# Patient Record
Sex: Female | Born: 1980 | Race: Black or African American | Hispanic: No | Marital: Married | Smoking: Never smoker
Health system: Southern US, Community
[De-identification: ages and names within clinical notes are randomized; demographics above are authoritative.]

## PROBLEM LIST (undated history)

## (undated) DIAGNOSIS — I10 Essential (primary) hypertension: Secondary | ICD-10-CM

## (undated) DIAGNOSIS — R7303 Prediabetes: Secondary | ICD-10-CM

## (undated) DIAGNOSIS — Z789 Other specified health status: Secondary | ICD-10-CM

## (undated) DIAGNOSIS — E282 Polycystic ovarian syndrome: Secondary | ICD-10-CM

## (undated) DIAGNOSIS — R87612 Low grade squamous intraepithelial lesion on cytologic smear of cervix (LGSIL): Secondary | ICD-10-CM

## (undated) DIAGNOSIS — N911 Secondary amenorrhea: Secondary | ICD-10-CM

## (undated) DIAGNOSIS — N979 Female infertility, unspecified: Secondary | ICD-10-CM

## (undated) DIAGNOSIS — L680 Hirsutism: Secondary | ICD-10-CM

## (undated) HISTORY — PX: NO PAST SURGERIES: SHX2092

## (undated) HISTORY — DX: Low grade squamous intraepithelial lesion on cytologic smear of cervix (LGSIL): R87.612

## (undated) HISTORY — DX: Hirsutism: L68.0

## (undated) HISTORY — DX: Essential (primary) hypertension: I10

## (undated) HISTORY — DX: Polycystic ovarian syndrome: E28.2

## (undated) HISTORY — DX: Female infertility, unspecified: N97.9

## (undated) HISTORY — DX: Secondary amenorrhea: N91.1

## (undated) HISTORY — DX: Prediabetes: R73.03

---

## 2005-01-02 DIAGNOSIS — R87612 Low grade squamous intraepithelial lesion on cytologic smear of cervix (LGSIL): Secondary | ICD-10-CM

## 2005-01-02 HISTORY — DX: Low grade squamous intraepithelial lesion on cytologic smear of cervix (LGSIL): R87.612

## 2007-01-24 ENCOUNTER — Emergency Department: Payer: Self-pay | Admitting: Unknown Physician Specialty

## 2009-10-22 ENCOUNTER — Encounter: Admission: RE | Admit: 2009-10-22 | Discharge: 2009-10-22 | Payer: Self-pay | Admitting: Family Medicine

## 2011-07-17 ENCOUNTER — Encounter: Payer: Self-pay | Admitting: Family Medicine

## 2011-08-11 ENCOUNTER — Encounter: Payer: Self-pay | Admitting: *Deleted

## 2011-08-11 ENCOUNTER — Emergency Department (HOSPITAL_BASED_OUTPATIENT_CLINIC_OR_DEPARTMENT_OTHER)
Admission: EM | Admit: 2011-08-11 | Discharge: 2011-08-11 | Disposition: A | Discharge status: Home / Self Care | Payer: Self-pay | Attending: Emergency Medicine | Admitting: Emergency Medicine

## 2011-08-11 DIAGNOSIS — R51 Headache: Secondary | ICD-10-CM | POA: Insufficient documentation

## 2011-08-11 DIAGNOSIS — R110 Nausea: Secondary | ICD-10-CM | POA: Insufficient documentation

## 2011-08-11 LAB — URINALYSIS, ROUTINE W REFLEX MICROSCOPIC
Bilirubin Urine: NEGATIVE
Hgb urine dipstick: NEGATIVE
Ketones, ur: NEGATIVE mg/dL
Leukocytes, UA: NEGATIVE
Protein, ur: NEGATIVE mg/dL
Urobilinogen, UA: 0.2 mg/dL (ref 0.0–1.0)

## 2011-08-11 MED ORDER — BUTALBITAL-APAP-CAFFEINE 50-325-40 MG PO TABS: 1.0000 | ORAL_TABLET | Freq: Four times a day (QID) | ORAL | Status: AC | PRN

## 2011-08-11 NOTE — ED Provider Notes (Signed)
History     CSN: [id number]  Arrival date & time 08/11/11  1503   First MD Initiated Contact with Patient 08/11/11 1612      Chief Complaint  Patient presents with  . Headache  . Nausea    (Consider location/radiation/quality/duration/timing/severity/associated sxs/prior treatment) Patient is a 31 y.o. female presenting with headaches. The history is provided by the patient. No language interpreter was used.  Headache  This is a new problem. The current episode started 6 to 12 hours ago. The problem occurs constantly. The problem has been gradually worsening. The headache is associated with nothing. The pain is located in the frontal region. The pain is at a severity of 7/10. The pain is moderate. The pain does not radiate. Associated symptoms include nausea. She has tried acetaminophen and aspirin for the symptoms. The treatment provided no relief.  Pt complains of headaches on and off.  Pt complains of nausea.  Pt has been having headaches for the past 2 weeks.  History reviewed. No pertinent past medical history.  History reviewed. No pertinent past surgical history.  No family history on file.  History  Substance Use Topics  . Smoking status: Never Smoker   . Smokeless tobacco: Never Used  . Alcohol Use: Yes     occasional    OB History    Grav Para Term Preterm Abortions TAB SAB Ect Mult Living                  Review of Systems  Gastrointestinal: Positive for nausea.  Neurological: Positive for headaches.  All other systems reviewed and are negative.    Allergies  Review of patient's allergies indicates no known allergies.  Home Medications   Current Outpatient Rx  Name Route Sig Dispense Refill  . EXCEDRIN TENSION HEADACHE PO Oral Take 2 tablets by mouth once.     . ADULT MULTIVITAMIN W/MINERALS CH Oral Take 1 tablet by mouth daily.        BP 130/82  Pulse 64  Temp(Src) 98.2 F (36.8 C) (Oral)  Resp 18  Ht 5\' 9"  (1.753 m)  Wt 264 lb (119.75 kg)   BMI 38.99 kg/m2  SpO2 100%  LMP 07/29/2011  Physical Exam  Nursing note and vitals reviewed. Constitutional: She is oriented to person, place, and time. She appears well-developed and well-nourished.  HENT:  Head: Normocephalic and atraumatic.  Right Ear: External ear normal.  Left Ear: External ear normal.  Nose: Nose normal.  Mouth/Throat: Oropharynx is clear and moist.  Eyes: Conjunctivae and EOM are normal. Pupils are equal, round, and reactive to light.  Neck: Normal range of motion. Neck supple.  Cardiovascular: Normal rate and normal heart sounds.   Pulmonary/Chest: Effort normal and breath sounds normal.  Abdominal: Soft. Bowel sounds are normal.  Musculoskeletal: Normal range of motion.  Neurological: She is alert and oriented to person, place, and time. She has normal reflexes.  Skin: Skin is warm.  Psychiatric: She has a normal mood and affect.    ED Course  Procedures (including critical care time)  Labs Reviewed  URINALYSIS, ROUTINE W REFLEX MICROSCOPIC - Abnormal; Notable for the following:    APPearance CLOUDY (*)    All other components within normal limits  PREGNANCY, URINE   No results found.   No diagnosis found.    MDM  Pt advised to follow up with her Md for recheck.   Rx for for Fiorcet for pain.       Langston Masker, [place]  08/11/11 1824 

## 2011-08-11 NOTE — ED Notes (Signed)
intermittent headache and nausea since Friday , denies vomiting

## 2011-08-12 NOTE — ED Provider Notes (Signed)
Medical screening examination/treatment/procedure(s) were performed by non-physician practitioner and as supervising physician I was immediately available for consultation/collaboration.   Darrah Dredge, MD 08/12/11 1233 

## 2013-06-22 ENCOUNTER — Ambulatory Visit (INDEPENDENT_AMBULATORY_CARE_PROVIDER_SITE_OTHER): Payer: No Typology Code available for payment source | Admitting: Family Medicine

## 2013-06-22 ENCOUNTER — Encounter: Payer: Self-pay | Admitting: Family Medicine

## 2013-06-22 VITALS — BP 128/92 | HR 83 | Temp 98.7°F | Resp 16 | Ht 67.5 in | Wt 282.2 lb

## 2013-06-22 DIAGNOSIS — Z0000 Encounter for general adult medical examination without abnormal findings: Secondary | ICD-10-CM

## 2013-06-22 DIAGNOSIS — R300 Dysuria: Secondary | ICD-10-CM

## 2013-06-22 DIAGNOSIS — E669 Obesity, unspecified: Secondary | ICD-10-CM

## 2013-06-22 DIAGNOSIS — N912 Amenorrhea, unspecified: Secondary | ICD-10-CM

## 2013-06-22 DIAGNOSIS — J019 Acute sinusitis, unspecified: Secondary | ICD-10-CM

## 2013-06-22 DIAGNOSIS — E65 Localized adiposity: Secondary | ICD-10-CM

## 2013-06-22 LAB — COMPREHENSIVE METABOLIC PANEL
ALT: 24 U/L (ref 0–35)
AST: 19 U/L (ref 0–37)
BUN: 12 mg/dL (ref 6–23)
Calcium: 8.8 mg/dL (ref 8.4–10.5)
Chloride: 103 mEq/L (ref 96–112)
Creat: 0.78 mg/dL (ref 0.50–1.10)
Glucose, Bld: 83 mg/dL (ref 70–99)
Potassium: 4.1 mEq/L (ref 3.5–5.3)
Total Bilirubin: 0.5 mg/dL (ref 0.3–1.2)
Total Protein: 7 g/dL (ref 6.0–8.3)

## 2013-06-22 LAB — POCT CBC
Granulocyte percent: 62.2 %G (ref 37–80)
HCT, POC: 44.6 % (ref 37.7–47.9)
Hemoglobin: 13.9 g/dL (ref 12.2–16.2)
Lymph, poc: 1.8 (ref 0.6–3.4)
MCHC: 31.2 g/dL — AB (ref 31.8–35.4)
MCV: 93.4 fL (ref 80–97)
MID (cbc): 0.5 (ref 0–0.9)
POC Granulocyte: 3.7 (ref 2–6.9)
POC LYMPH PERCENT: 30.1 %L (ref 10–50)
POC MID %: 7.7 %M (ref 0–12)
Platelet Count, POC: 257 10*3/uL (ref 142–424)
RDW, POC: 15.3 %
WBC: 5.9 10*3/uL (ref 4.6–10.2)

## 2013-06-22 LAB — POCT UA - MICROSCOPIC ONLY
RBC, urine, microscopic: NEGATIVE
Yeast, UA: NEGATIVE

## 2013-06-22 LAB — POCT GLYCOSYLATED HEMOGLOBIN (HGB A1C): Hemoglobin A1C: 5.2

## 2013-06-22 LAB — POCT URINALYSIS DIPSTICK
Blood, UA: NEGATIVE
Glucose, UA: NEGATIVE
Leukocytes, UA: NEGATIVE
Nitrite, UA: NEGATIVE
Protein, UA: NEGATIVE
Spec Grav, UA: 1.025

## 2013-06-22 LAB — LIPID PANEL: LDL Cholesterol: 110 mg/dL — ABNORMAL HIGH (ref 0–99)

## 2013-06-22 LAB — TSH: TSH: 2.567 u[IU]/mL (ref 0.350–4.500)

## 2013-06-22 MED ORDER — AMOXICILLIN 500 MG PO CAPS: 1000.0000 mg | ORAL_CAPSULE | Freq: Two times a day (BID) | ORAL | Status: DC

## 2013-06-22 NOTE — Patient Instructions (Signed)
We will refer you to OB to discuss getting your periods back.  Weight loss will also help with this!  Work on getting back into exercise (at least 4x a week) and watch your diet.  I will be in touch with your labs when they come in.    Switch to diet drinks!    Let us know if the pain in your right chest continues or comes back

## 2013-06-22 NOTE — Progress Notes (Signed)
[facility] [address], [place] [address] [phone]  Date:  06/22/2013   Name:  Hailey Harris   DOB:  [birth year]   MRN:  [id number]  PCP:  No primary provider on file.    Chief Complaint: Annual Exam   History of Present Illness:  Hailey Harris is a 32 y.o. very pleasant female patient who presents with the following:  Here today or a CPE and pap. She also has several other concerns today  Last pap was performed 03/2012.  She did have an abnormal pap in 2006.  She had colposcopy and all looked ok, ok follow-up since then.   She has noted urinary sx; urgency, no pain. No frequency.  She has noted this for about 3 months.  She does not notice any change in her schedule and is able to urinate regularly at her job.    LMP April 18th; she got off Depo in 2006 and notes infrequency menses since.  She is SA; she does not use any contraception.   She has not taken a pregnancy test at home. She has never been pregnant but would like to conceive in the next few years.    Also, she has been ill for the last 4 days with ST, runny nose, headache, sinus congestion.  She has not had a fever but she did have chills She has been coughing- non productive.    Last night she noted a pain under her right breast.  This is now resolved.  She noted it prior to laying down to sleep and when she was in bed. Non- exertional, she has not had this in the past  She also notes leg swelling and pains in her joints; this will come and go over the last few months.    Today her legs look normal.    She has gained about 30 lbs over the last 3 years.    No meds, no allergies.  Non- smoker, does drink on occasion.  No other major health problems in the past. Admits that she used to exercise a good bit but over the last few months she has stopped; "I just don't have the energy."     She is NOT fasting today There are no active problems to display for this patient.   History  reviewed. No pertinent past medical history.  History reviewed. No pertinent past surgical history.  History  Substance Use Topics  . Smoking status: Never Smoker   . Smokeless tobacco: Never Used  . Alcohol Use: Yes     Comment: occasional    Family History  Problem Relation Age of Onset  . Cancer Mother     breast cancer   . Heart disease Father     No Known Allergies  Medication list has been reviewed and updated.  Current Outpatient Prescriptions on File Prior to Visit  Medication Sig Dispense Refill  . Multiple Vitamin (MULITIVITAMIN WITH MINERALS) TABS Take 1 tablet by mouth daily.        . Acetaminophen-Caffeine (EXCEDRIN TENSION HEADACHE PO) Take 2 tablets by mouth once.        No current facility-administered medications on file prior to visit.    Review of Systems:  As per HPI- otherwise negative.   Physical Examination: Filed Vitals:   06/22/13 0859  BP: 128/92  Pulse: 83  Temp: 98.7 F (37.1 C)  Resp: 16   Filed Vitals:   06/22/13 0859  Height: 5' 7.5" (1.715  m)  Weight: 282 lb 3.2 oz (128.005 kg)   Body mass index is 43.52 kg/(m^2). Ideal Body Weight: Weight in (lb) to have BMI = 25: 161.7  GEN: WDWN, NAD, Non-toxic, A & O x 3, obese, looks well except for nasal congestion HEENT: Atraumatic, Normocephalic. Neck supple. No masses, No LAD.  Bilateral TM wnl, oropharynx normal.  PEERL,EOMI.   Ears and Nose: No external deformity. CV: RRR, No M/G/R. No JVD. No thrill. No extra heart sounds. PULM: CTA B, no wheezes, crackles, rhonchi. No retractions. No resp. distress. No accessory muscle use. ABD: S, NT, ND, +BS. No rebound. No HSM. EXTR: No c/c/e NEURO Normal gait.  PSYCH: Normally interactive. Conversant. Not depressed or anxious appearing.  Calm demeanor.  Breast exam: normal exam.  No masses or discharge.  Able to reproudce tenderness on her chest wall under the right breast Gu: normal exam.  Difficult to find cervix due to obesity.  No CMT,  no adnexal masses or tenderness  LE: no edema or tenderness   Results for orders placed in visit on 06/22/13  COMPREHENSIVE METABOLIC PANEL      Result Value Range   Sodium 139  135 - 145 mEq/L   Potassium 4.1  3.5 - 5.3 mEq/L   Chloride 103  96 - 112 mEq/L   CO2 30  19 - 32 mEq/L   Glucose, Bld 83  70 - 99 mg/dL   BUN 12  6 - 23 mg/dL   Creat 4.09  8.11 - 9.14 mg/dL   Total Bilirubin 0.5  0.3 - 1.2 mg/dL   Alkaline Phosphatase 82  39 - 117 U/L   AST 19  0 - 37 U/L   ALT 24  0 - 35 U/L   Total Protein 7.0  6.0 - 8.3 g/dL   Albumin 4.2  3.5 - 5.2 g/dL   Calcium 8.8  8.4 - 78.2 mg/dL  TSH      Result Value Range   TSH 2.567  0.350 - 4.500 uIU/mL  LIPID PANEL      Result Value Range   Cholesterol 173  0 - 200 mg/dL   Triglycerides 956  <213 mg/dL   HDL 42  >08 mg/dL   Total CHOL/HDL Ratio 4.1     VLDL 21  0 - 40 mg/dL   LDL Cholesterol 657 (*) 0 - 99 mg/dL  POCT CBC      Result Value Range   WBC 5.9  4.6 - 10.2 K/uL   Lymph, poc 1.8  0.6 - 3.4   POC LYMPH PERCENT 30.1  10 - 50 %L   MID (cbc) 0.5  0 - 0.9   POC MID % 7.7  0 - 12 %M   POC Granulocyte 3.7  2 - 6.9   Granulocyte percent 62.2  37 - 80 %G   RBC 4.77  4.04 - 5.48 M/uL   Hemoglobin 13.9  12.2 - 16.2 g/dL   HCT, POC 84.6  96.2 - 47.9 %   MCV 93.4  80 - 97 fL   MCH, POC 29.1  27 - 31.2 pg   MCHC 31.2 (*) 31.8 - 35.4 g/dL   RDW, POC 95.2     Platelet Count, POC 257  142 - 424 K/uL   MPV 9.7  0 - 99.8 fL  POCT UA - MICROSCOPIC ONLY      Result Value Range   WBC, Ur, HPF, POC 0-2     RBC, urine, microscopic neg  Bacteria, U Microscopic neg     Mucus, UA trace     Epithelial cells, urine per micros 0-2     Crystals, Ur, HPF, POC neg     Casts, Ur, LPF, POC neg     Yeast, UA neg    POCT URINALYSIS DIPSTICK      Result Value Range   Color, UA yellow     Clarity, UA clear     Glucose, UA neg     Bilirubin, UA neg     Ketones, UA neg     Spec Grav, UA 1.025     Blood, UA neg     pH, UA 5.5      Protein, UA neg     Urobilinogen, UA 1.0     Nitrite, UA neg     Leukocytes, UA Negative    POCT URINE PREGNANCY      Result Value Range   Preg Test, Ur Negative    POCT GLYCOSYLATED HEMOGLOBIN (HGB A1C)      Result Value Range   Hemoglobin A1C 5.2      Assessment and Plan: Physical exam - Plan: POCT CBC, Comprehensive metabolic panel, Pap IG, CT/NG w/ reflex HPV when ASC-U  Amenorrhea - Plan: POCT urine pregnancy, Ambulatory referral to Obstetrics / Gynecology  Dysuria - Plan: POCT UA - Microscopic Only, POCT urinalysis dipstick  Obesity, unspecified - Plan: POCT glycosylated hemoglobin (Hb A1C), TSH, Lipid panel  Sinusitis, acute - Plan: amoxicillin (AMOXIL) 500 MG capsule  Performed pap and exam.   Amenorrhea: explained that this is likely due to obesity.  As she would like to become pregnant will refer her to OBG for evaluation.  Encouraged weight loss to help with this issue and her general health  Treat for sinusitis with amoxicillin. No evidence of UTI or other urinary issue.  Check GC/CMZ on her pap.  Encouraged her to drink regularly during the day and void every few hours to see if a better schedule will improve her sx Will plan further follow- up pending labs. Discussed exercise and diet changes that may be helpful for her Suspect her CP was MSK in origin.  At this time her exam and VS are reassuring.  She will let us know if this recurs     Signed Abbe Amsterdam, MD

## 2013-06-23 LAB — PAP IG, CT-NG, RFX HPV ASCU

## 2013-06-28 ENCOUNTER — Encounter: Payer: Self-pay | Admitting: Obstetrics & Gynecology

## 2013-07-19 ENCOUNTER — Ambulatory Visit: Payer: No Typology Code available for payment source | Admitting: Podiatry

## 2013-07-19 ENCOUNTER — Ambulatory Visit (INDEPENDENT_AMBULATORY_CARE_PROVIDER_SITE_OTHER): Payer: No Typology Code available for payment source | Admitting: Podiatry

## 2013-07-19 ENCOUNTER — Encounter: Payer: Self-pay | Admitting: Podiatry

## 2013-07-19 ENCOUNTER — Ambulatory Visit (INDEPENDENT_AMBULATORY_CARE_PROVIDER_SITE_OTHER): Payer: No Typology Code available for payment source

## 2013-07-19 VITALS — BP 121/73 | HR 60 | Resp 16 | Ht 70.0 in | Wt 282.0 lb

## 2013-07-19 DIAGNOSIS — G5760 Lesion of plantar nerve, unspecified lower limb: Secondary | ICD-10-CM

## 2013-07-19 DIAGNOSIS — M79609 Pain in unspecified limb: Secondary | ICD-10-CM

## 2013-07-19 DIAGNOSIS — G5782 Other specified mononeuropathies of left lower limb: Secondary | ICD-10-CM

## 2013-07-19 DIAGNOSIS — M79672 Pain in left foot: Secondary | ICD-10-CM

## 2013-07-19 NOTE — Progress Notes (Signed)
   Subjective:    Patient ID: Hailey Harris, female    DOB: [birth year], 32 y.o.   MRN: [id number]  HPI Comments: Hailey Harris been having foot pain  N sharp shooting pain  L left 5th toe down to the lateral side of foot  D 1 month  O all of sudden , think twisted foot and had pain since  C got worse when started a new job on feet all day  A by the end of day its worse  T ice    Foot Pain Associated symptoms include coughing.      Review of Systems  HENT:       Sinus problems and some ringing in the ears  Respiratory: Positive for cough.   All other systems reviewed and are negative.       Objective:   Physical Exam: I have reviewed her past medical history medications allergies surgeries social history review of systems. Vital signs are stable she is alert and oriented x3. Pulses are strongly palpable bilateral. Capillary fill time to digits one through 5 bilateral foot is noted to be immediate. Deep tendon reflexes are intact symmetrical bilateral. Muscle strength is 5 over 5 dorsiflexors plantar flexors inverters everters all intrinsic musculature is intact. Orthopedic evaluation demonstrates all joints distal to the ankle a full range of motion without crepitus she has some tenderness on palpation of the lateral column of the left foot. Upon further evaluation is noted that she does have pain between the third and fourth digits of the left foot with a palpable Mulder's click. Radiographic evaluation does not demonstrate any type of osseous abnormalities to the left foot.        Assessment & Plan:  Assessment: Neuroma third interdigital space left foot.  Plan: Cortisone injection 20 mg of Kenalog third interdigital space left. Followup with me in one month.

## 2013-08-15 ENCOUNTER — Encounter: Payer: Self-pay | Admitting: Obstetrics & Gynecology

## 2013-08-16 ENCOUNTER — Ambulatory Visit: Payer: No Typology Code available for payment source | Admitting: Podiatry

## 2013-08-19 ENCOUNTER — Encounter: Payer: Self-pay | Admitting: Medical

## 2013-08-19 ENCOUNTER — Ambulatory Visit (INDEPENDENT_AMBULATORY_CARE_PROVIDER_SITE_OTHER): Payer: No Typology Code available for payment source | Admitting: Medical

## 2013-08-19 VITALS — BP 134/93 | HR 64 | Ht 71.0 in | Wt 282.7 lb

## 2013-08-19 DIAGNOSIS — N926 Irregular menstruation, unspecified: Secondary | ICD-10-CM

## 2013-08-19 NOTE — Progress Notes (Signed)
Patients lmp is 07/25/13. Prior to that her last period was in April. She has had 2 cycles per year for the last 4 years or so. Patient stated she was here for infertility, I advised that we don't do infertility in our office but can give her resources for it. We can see her for the amenorrhea, pt is agreeable to this.

## 2013-08-19 NOTE — Patient Instructions (Signed)
Infertility WHAT IS INFERTILITY?  Infertility is usually defined as not being able to get pregnant after trying for one year of regular sexual intercourse without the use of contraceptives. Or not being able to carry a pregnancy to term and have a baby. The infertility rate in the [place] is around 10%. Pregnancy is the result of a chain of events. A woman must release an egg from one of her ovaries (ovulation). The egg must be fertilized by the female sperm. Then it travels through a fallopian tube into the uterus (womb), where it attaches to the wall of the uterus and grows. A man must have enough sperm, and the sperm must join with (fertilize) the egg along the way, at the proper time. The fertilized egg must then become attached to the inside of the uterus. While this may seem simple, many things can happen to prevent pregnancy from occurring.  WHOSE PROBLEM IS IT?  About 20% of infertility cases are due to problems with the man (female factors) and 65% are due to problems with the woman (female factors). Other cases are due to a combination of female and female factors or to unknown causes.  WHAT CAUSES INFERTILITY IN MEN?  Infertility in men is often caused by problems with making enough normal sperm or getting the sperm to reach the egg. Problems with sperm may exist from birth or develop later in life, due to illness or injury. Some men produce no sperm, or produce too few sperm (oligospermia). Other problems include:  Sexual dysfunction.  Hormonal or endocrine problems.  Age. Female fertility decreases with age, but not at as young an age as female fertility.  Infection.  Congenital problems. Birth defect, such as absence of the tubes that carry the sperm (vas deferens).  Genetic/chromosomal problems.  Antisperm antibody problems.  Retrograde ejaculation (sperm go into the bladder).  Varicoceles, spematoceles, or tumors of the testicles.  Lifestyle can influence the number and  quality of a man's sperm.  Alcohol and drugs can temporarily reduce sperm quality.  Environmental toxins, including pesticides and lead, may cause some cases of infertility in men. WHAT CAUSES INFERTILITY IN WOMEN?   Problems with ovulation account for most infertility in women. Without ovulation, eggs are not available to be fertilized.  Signs of problems with ovulation include irregular menstrual periods or no periods at all.  Simple lifestyle factors, including stress, diet, or athletic training, can affect a woman's hormonal balance.  Age. Fertility begins to decrease in women in the early 76s and is worse after age 71.  Much less often, a hormonal imbalance from a serious medical problem, such as a pituitary gland tumor, thyroid or other chronic medical disease, can cause ovulation problems.  Pelvic infections.  Polycystic ovary syndrome (increase in female hormones, unable to ovulate).  Alcohol or illegal drugs.  Environmental toxins, radiation, pesticides, and certain chemicals.  Aging is an important factor in female infertility.  The ability of a woman's ovaries to produce eggs declines with age, especially after age 27. About one third of couples where the woman is over 21 will have problems with fertility.  By the time she reaches menopause when her monthly periods stop for good, a woman can no longer produce eggs or become pregnant.  Other problems can also lead to infertility in women. If the fallopian tubes are blocked at one or both ends, the egg cannot travel through the tubes into the uterus. Scar tissue (adhesions) in the pelvis may cause blocked  tubes. This may result from pelvic inflammatory disease, endometriosis, or surgery for an ectopic pregnancy (fertilized egg implanted outside the uterus) or any pelvic or abdominal surgery causing adhesions.  Fibroid tumors or polyps of the uterus.  Congenital (birth defect) abnormalities of the uterus.  Infection of the  cervix (cervicitis).  Cervical stenosis (narrowing).  Abnormal cervical mucus.  Polycystic ovary syndrome.  Having sexual intercourse too often (every other day or 4 to 5 times a week).  Obesity.  Anorexia.  Poor nutrition.  Over exercising, with loss of body fat.  DES. Your mother received diethylstilbesterol hormone when pregnant with you. HOW IS INFERTILITY TESTED?  If you have been trying to have a baby without success, you may want to seek medical help. You should not wait for one year of trying before seeing a health care provider if:  You are over 35.  You have reason to believe that there may be a fertility problem. A medical evaluation may determine the reasons for a couple's infertility. Usually this process begins with:  Physical exams.  Medical histories of both partners.  Sexual histories of both partners. If there is no obvious problem, like improperly timed intercourse or absence of ovulation, tests may be needed.   For a man, testing usually begins with tests of his semen to look at:  The number of sperm.  The shape of sperm.  Movement of his sperm.  Taking a complete medical and surgical history.  Physical examination.  Check for infection of the female reproductive organs. Sometimes hormone tests are done.   For a woman, the first step in testing is to find out if she is ovulating each month. There are several ways to do this. For example, she can keep track of changes in her morning body temperature and in the texture of her cervical mucus. Another tool is a home ovulation test kit, which can be bought at drug or grocery stores.  Checks of ovulation can also be done in the doctor's office, using blood tests for hormone levels or ultrasound tests of the ovaries. If the woman is ovulating, more tests will need to be done. Some common female tests include:  Hysterosalpingogram: An x-ray of the fallopian tubes and uterus after they are injected with  dye. It shows if the tubes are open and shows the shape of the uterus.  Laparoscopy: An exam of the tubes and other female organs for disease. A lighted tube called a laparoscope is used to see inside the abdomen.  Endometrial biopsy: Sample of uterus tissue taken on the first day of the menstrual period, to see if the tissue indicates you are ovulating.  Transvaginal ultrasound: Examines the female organs.  Hysteroscopy: Uses a lighted tube to examine the cervix and inside the uterus, to see if there are any abnormalities inside the uterus. TREATMENT  Depending on the test results, different treatments can be suggested. The type of treatment depends on the cause. 85 to 90% of infertility cases are treated with drugs or surgery.   Various fertility drugs may be used for women with ovulation problems. It is important to talk with your caregiver about the drug to be used. You should understand the drug's benefits and side effects. Depending on the type of fertility drug and the dosage of the drug used, multiple births (twins or multiples) can occur in some women.  If needed, surgery can be done to repair damage to a woman's ovaries, fallopian tubes, cervix, or uterus.  Surgery  or medical treatment for endometriosis or polycystic ovary syndrome. Sometimes a man has an infertility problem that can be corrected with medicine or by surgery.  Intrauterine insemination (IUI) of sperm, timed with ovulation.  Change in lifestyle, if that is the cause (lose weight, increase exercise, and stop smoking, drinking excessively, or taking illegal drugs).  Other types of surgery:  Removing growths inside and on the uterus.  Removing scar tissue from inside of the uterus.  Fixing blocked tubes.  Removing scar tissue in the pelvis and around the female organs. WHAT IS ASSISTED REPRODUCTIVE TECHNOLOGY (ART)?  Assisted reproductive technology (ART) is another form of special methods used to help infertile  couples. ART involves handling both the woman's eggs and the man's sperm. Success rates vary and depend on many factors. ART can be expensive and time-consuming. But ART has made it possible for many couples to have children that otherwise would not have been conceived. Some methods are listed below:  In vitro fertilization (IVF). IVF is often used when a woman's fallopian tubes are blocked or when a man has low sperm counts. A drug is used to stimulate the ovaries to produce multiple eggs. Once mature, the eggs are removed and placed in a culture dish with the man's sperm for fertilization. After about 40 hours, the eggs are examined to see if they have become fertilized by the sperm and are dividing into cells. These fertilized eggs (embryos) are then placed in the woman's uterus. This bypasses the fallopian tubes.  Gamete intrafallopian transfer (GIFT) is similar to IVF, but used when the woman has at least one normal fallopian tube. Three to five eggs are placed in the fallopian tube, along with the man's sperm, for fertilization inside the woman's body.  Zygote intrafallopian transfer (ZIFT), also called tubal embryo transfer, combines IVF and GIFT. The eggs retrieved from the woman's ovaries are fertilized in the lab and placed in the fallopian tubes rather than in the uterus.  ART procedures sometimes involve the use of donor eggs (eggs from another woman) or previously frozen embryos. Donor eggs may be used if a woman has impaired ovaries or has a genetic disease that could be passed on to her baby.  When performing ART, you are at higher risk for resulting in multiple pregnancies, twins, triplets or more.  Intracytoplasma sperm injection is a procedure that injects a single sperm into the egg to fertilize it.  Embryo transplant is a procedure that starts after growing an embryo in a special media (chemical solution) developed to keep the embryo alive for 2 to 5 days, and then transplanting it  into the uterus. In cases where a cause cannot be found and pregnancy does not occur, adoption may be a consideration. Document Released: 07/24/2003 Document Revised: 10/13/2011 Document Reviewed: 06/19/2009 [employer] Patient Information 2014 [employer].  

## 2013-08-19 NOTE — Progress Notes (Signed)
Patient ID: Hailey Harris, female   DOB: October 09, 1980, 33 y.o.   MRN: [id number]  History:  Hailey Harris is a 33 y.o. [id number] who presents to clinic today for irregular menses.  The patient states a 4 year history of ~ 2 cycles per year. She states that her cycles generally last 7 days. LMP 07/25/13. The patient was seen by [facility] in [place] in 2012 and given Provera. She took the Provera monthly x 1 year and had a period every month from 06/2011 - 06/2012. Last pap smear was normal 06/2013 performed at [facility]. She denies any vaginal discharge or bleeding today. No abdominal pain or other GYN concerns. The patient does desire pregnancy.  The following portions of the patient's history were reviewed and updated as appropriate: allergies, current medications, past family history, past medical history, past social history, past surgical history and problem list.  Review of Systems:  Pertinent items are noted in HPI.  Objective:  Physical Exam BP 134/93  Pulse 64  Ht 5\' 11"  (1.803 m)  Wt 282 lb 11.2 oz (128.232 kg)  BMI 39.45 kg/m2  LMP 07/25/2013 GENERAL: Well-developed, well-nourished female in no acute distress.  HEENT: Normocephalic, atraumatic.  NECK: Supple. Normal thyroid.  LUNGS: Normal rate. Clear to auscultation bilaterally.  HEART: Regular rate and rhythm with no adventitious sounds.  BREASTS: Deferred ABDOMEN: Soft, nontender, nondistended. No organomegaly. Normal bowel sounds appreciated in all quadrants.  PELVIC: Deferred EXTREMITIES: No cyanosis, clubbing, or edema.   Assessment & Plan:  Assessment: Irregular menses  Plans: 1. Patient given contact information for Dr. Kerin Perna for infertility appoitment 2. Patient advised to call [facility] clinic for return visit PRN  Farris Has, PA-C 08/19/2013 10:12 AM

## 2013-08-23 ENCOUNTER — Telehealth: Payer: Self-pay | Admitting: *Deleted

## 2013-08-23 NOTE — Telephone Encounter (Signed)
Pt left message stating she was seen in our office on 08/19/13 by Almyra Free. She was referred to infertility doctor bit her insurance does not cover those visits. She is requesting a Rx for Provera and states "she will take it more regularly this time."  I returned call to pt and left a message stating that I have sent a message to [employer] with her medication request. We will call her back once we have received a response.

## 2013-08-24 NOTE — Telephone Encounter (Signed)
Called patient and left message for her to call the office with regards to medication dosages.

## 2013-08-24 NOTE — Telephone Encounter (Addendum)
Diane,   I assume the patient was doing 10 mg x 10 days per month of Provera last time. Can we confirm that and then I will send Rx that is appropriate.   Thanks,  Almyra Free  Note from [facility]; I called pt back and left a new message stating that I have had a response from [place] and have additional questions about how she previously took the medication. Please call back and leave a message stating whether we may leave detailed medical information on her voice mail.

## 2013-08-25 ENCOUNTER — Other Ambulatory Visit: Payer: Self-pay | Admitting: Medical

## 2013-08-25 DIAGNOSIS — N912 Amenorrhea, unspecified: Secondary | ICD-10-CM

## 2013-08-25 MED ORDER — MEDROXYPROGESTERONE ACETATE 10 MG PO TABS: ORAL_TABLET | ORAL | Status: DC

## 2013-08-25 NOTE — Progress Notes (Signed)
Patient was told at last visit that if she could not be seen by infertility we could try provera challenge again. Patient called [facility] clinic and told RN that infertility would not accept her insurance and she wanted to try Provera. Rx sent to patient's pharmacy. Patient to return to [facility] clinic in 6 months for follow-up  Farris Has, PA-C 08/25/2013 1:00 PM

## 2013-08-25 NOTE — Telephone Encounter (Signed)
Rx for provera sent with refills x 5. Patient should make an appointment to follow-up in ~ 6 months to see how things are going.

## 2013-08-25 NOTE — Telephone Encounter (Signed)
Called patient stating I was returning her phone call and wanted to confirm her Provera dosage and how often she took it. Patient stated it was the Provera 10mg  and she took it from the 1st-10th of each month. Told patient I would pass this information on to Almyra Free and Almyra Free will put that refill in and your pharmacy will call you when the medication is ready for pickup. Patient verbalized understanding and had no further questions

## 2013-08-29 NOTE — Telephone Encounter (Signed)
Called patient and informed her of refills and need for appt in July for follow up. Patient verbalized understanding and had no further questions

## 2014-02-16 ENCOUNTER — Emergency Department (HOSPITAL_BASED_OUTPATIENT_CLINIC_OR_DEPARTMENT_OTHER)
Admission: EM | Admit: 2014-02-16 | Discharge: 2014-02-16 | Disposition: A | Discharge status: Home / Self Care | Payer: Self-pay | Attending: Emergency Medicine | Admitting: Emergency Medicine

## 2014-02-16 ENCOUNTER — Encounter (HOSPITAL_BASED_OUTPATIENT_CLINIC_OR_DEPARTMENT_OTHER): Payer: Self-pay | Admitting: Emergency Medicine

## 2014-02-16 DIAGNOSIS — T23269A Burn of second degree of back of unspecified hand, initial encounter: Secondary | ICD-10-CM | POA: Insufficient documentation

## 2014-02-16 DIAGNOSIS — Z23 Encounter for immunization: Secondary | ICD-10-CM | POA: Insufficient documentation

## 2014-02-16 DIAGNOSIS — Y929 Unspecified place or not applicable: Secondary | ICD-10-CM | POA: Insufficient documentation

## 2014-02-16 DIAGNOSIS — T23201A Burn of second degree of right hand, unspecified site, initial encounter: Secondary | ICD-10-CM

## 2014-02-16 DIAGNOSIS — IMO0002 Reserved for concepts with insufficient information to code with codable children: Secondary | ICD-10-CM | POA: Insufficient documentation

## 2014-02-16 DIAGNOSIS — Y9389 Activity, other specified: Secondary | ICD-10-CM | POA: Insufficient documentation

## 2014-02-16 DIAGNOSIS — T798XXA Other early complications of trauma, initial encounter: Secondary | ICD-10-CM

## 2014-02-16 DIAGNOSIS — X19XXXA Contact with other heat and hot substances, initial encounter: Secondary | ICD-10-CM | POA: Insufficient documentation

## 2014-02-16 MED ORDER — CEPHALEXIN 500 MG PO CAPS: 500.0000 mg | ORAL_CAPSULE | Freq: Three times a day (TID) | ORAL | Status: DC

## 2014-02-16 MED ORDER — IBUPROFEN 400 MG PO TABS
600.0000 mg | ORAL_TABLET | Freq: Once | ORAL | Status: AC
  Administered 2014-02-16: 600 mg via ORAL
  Filled 2014-02-16 (×2): qty 1

## 2014-02-16 MED ORDER — DIPHENHYDRAMINE HCL 25 MG PO CAPS
50.0000 mg | ORAL_CAPSULE | Freq: Once | ORAL | Status: AC
  Administered 2014-02-16: 50 mg via ORAL
  Filled 2014-02-16: qty 2

## 2014-02-16 MED ORDER — SILVER SULFADIAZINE 1 % EX CREA
TOPICAL_CREAM | Freq: Every day | CUTANEOUS | Status: DC
  Administered 2014-02-16: 1 via TOPICAL
  Filled 2014-02-16: qty 85

## 2014-02-16 MED ORDER — TETANUS-DIPHTH-ACELL PERTUSSIS 5-2.5-18.5 LF-MCG/0.5 IM SUSP
0.5000 mL | Freq: Once | INTRAMUSCULAR | Status: AC
  Administered 2014-02-16: 0.5 mL via INTRAMUSCULAR
  Filled 2014-02-16: qty 0.5
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
## 2014-02-16 NOTE — Discharge Instructions (Signed)
Used topically to buttocks once daily and take oral antibiotics. Followup with her primary Dr. for reassessment in 3-4 days.  If you were given medicines take as directed.  If you are on coumadin or contraceptives realize their levels and effectiveness is altered by many different medicines.  If you have any reaction (rash, tongues swelling, other) to the medicines stop taking and see a physician.   Please follow up as directed and return to the ER or see a physician for new or worsening symptoms.  Thank you. Filed Vitals:   02/16/14 1122  BP: 150/87  Pulse: 53  Temp: 97.9 F (36.6 C)  TempSrc: Oral  Resp: 14  Height: 5\' 10"  (1.778 m)  Weight: 260 lb (117.935 kg)  SpO2: 99%    Burn Care Your skin is a natural barrier to infection. It is the largest organ of your body. Burns damage this natural protection. To help prevent infection, it is very important to follow your caregiver's instructions in the care of your burn. Burns are classified as:  First degree. There is only redness of the skin (erythema). No scarring is expected.  Second degree. There is blistering of the skin. Scarring may occur with deeper burns.  Third degree. All layers of the skin are injured, and scarring is expected. HOME CARE INSTRUCTIONS   Wash your hands well before changing your bandage.  Change your bandage as often as directed by your caregiver.  Remove the old bandage. If the bandage sticks, you may soak it off with cool, clean water.  Cleanse the burn thoroughly but gently with mild soap and water.  Pat the area dry with a clean, dry cloth.  Apply a thin layer of antibacterial cream to the burn.  Apply a clean bandage as instructed by your caregiver.  Keep the bandage as clean and dry as possible.  Elevate the affected area for the first 24 hours, then as instructed by your caregiver.  Only take over-the-counter or prescription medicines for pain, discomfort, or fever as directed by your  caregiver. SEEK IMMEDIATE MEDICAL CARE IF:   You develop excessive pain.  You develop redness, tenderness, swelling, or red streaks near the burn.  The burned area develops yellowish-white fluid (pus) or a bad smell.  You have a fever. MAKE SURE YOU:   Understand these instructions.  Will watch your condition.  Will get help right away if you are not doing well or get worse. Document Released: 07/21/2005 Document Revised: 10/13/2011 Document Reviewed: 12/11/2010 [facility] Patient Information 2015 [place], [place]. This information is not intended to replace advice given to you by your health care provider. Make sure you discuss any questions you have with your health care provider.
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
## 2014-05-25 DIAGNOSIS — Z9189 Other specified personal risk factors, not elsewhere classified: Secondary | ICD-10-CM | POA: Insufficient documentation

## 2014-06-30 ENCOUNTER — Encounter: Payer: Self-pay | Admitting: Medical

## 2015-07-15 ENCOUNTER — Ambulatory Visit
Admission: EM | Admit: 2015-07-15 | Discharge: 2015-07-15 | Disposition: A | Discharge status: Home / Self Care | Payer: BC Managed Care – PPO | Attending: Family Medicine | Admitting: Family Medicine

## 2015-07-15 DIAGNOSIS — L739 Follicular disorder, unspecified: Secondary | ICD-10-CM

## 2015-07-15 DIAGNOSIS — L089 Local infection of the skin and subcutaneous tissue, unspecified: Secondary | ICD-10-CM

## 2015-07-15 DIAGNOSIS — A499 Bacterial infection, unspecified: Secondary | ICD-10-CM

## 2015-07-15 DIAGNOSIS — R21 Rash and other nonspecific skin eruption: Secondary | ICD-10-CM

## 2015-07-15 DIAGNOSIS — B9689 Other specified bacterial agents as the cause of diseases classified elsewhere: Secondary | ICD-10-CM

## 2015-07-15 HISTORY — DX: Other specified health status: Z78.9

## 2015-07-15 LAB — RAPID STREP SCREEN (MED CTR MEBANE ONLY): STREPTOCOCCUS, GROUP A SCREEN (DIRECT): NEGATIVE

## 2015-07-15 MED ORDER — LORATADINE 10 MG PO TABS: 10.0000 mg | ORAL_TABLET | Freq: Every day | ORAL | Status: DC

## 2015-07-15 MED ORDER — CEFUROXIME AXETIL 500 MG PO TABS: 500.0000 mg | ORAL_TABLET | Freq: Two times a day (BID) | ORAL | Status: DC

## 2015-07-15 MED ORDER — RANITIDINE HCL 150 MG PO CAPS: 150.0000 mg | ORAL_CAPSULE | Freq: Two times a day (BID) | ORAL | Status: DC

## 2015-07-15 NOTE — ED Notes (Signed)
Patient states that she did a telemed visit on Friday and they prescribed prednisone. She states that she woke up this morning and she allergic reaction. She has a rash diffusely on chest and face. She denies any shortness of breath or mouth swelling. She states that she does feel like the rash is getting worse.

## 2015-07-15 NOTE — ED Provider Notes (Signed)
CSN: [id number]     Arrival date & time 07/15/15  1100 History   First MD Initiated Contact with Patient 07/15/15 1219    Nurses notes were reviewed. Chief Complaint  Patient presents with  . Allergic Reaction   patient reports Thursday began a rash on her neck. She states the urgent cares while closed so she called the telemedicine services she had access to and they placed her on prednisone which she told him that she was breaking rash. She started prednisone on Friday and by today the rash is a lot worse and she was worried that she was having a reaction to the prednisone. The rash is constantly on the neck chest and lower part of the face. She denies any shortness of breath she's been taking Benadryl she's not had anything like this before. (Consider location/radiation/quality/duration/timing/severity/associated sxs/prior Treatment) Patient is a 34 y.o. female presenting with allergic reaction. The history is provided by the patient. No language interpreter was used.  Allergic Reaction Presenting symptoms: no difficulty breathing, no difficulty swallowing and no wheezing   Severity:  Moderate Prior allergic episodes:  No prior episodes Context: no animal exposure, no chemicals, no cosmetics, no dairy/milk products, no food allergies, no grass, no insect bite/sting, no medications and no new detergents/soaps   Relieved by:  Nothing Ineffective treatments:  Steroids   Past Medical History  Diagnosis Date  . Patient denies medical problems    Past Surgical History  Procedure Laterality Date  . No past surgeries     Family History  Problem Relation Age of Onset  . Breast cancer Mother     Triple Negative  . Heart disease Father    Social History  Substance Use Topics  . Smoking status: Never Smoker   . Smokeless tobacco: None  . Alcohol Use: No     Comment: occasional   OB History    Gravida Para Term Preterm AB TAB SAB Ectopic Multiple Living   0 0 0 0 0 0 0 0        Review of Systems  HENT: Negative for trouble swallowing.   Respiratory: Negative for shortness of breath and wheezing.   All other systems reviewed and are negative.   Allergies  Review of patient's allergies indicates no known allergies.  Home Medications   Prior to Admission medications   Medication Sig Start Date End Date Taking? Authorizing Provider  medroxyPROGESTERone (PROVERA) 10 MG tablet Take 1 tab (10 mg) daily PO from the 1st-10th of each month 08/25/13  Yes Luvenia Redden, PA-C  Multiple Vitamin (MULITIVITAMIN WITH MINERALS) TABS Take 1 tablet by mouth daily.     Yes Historical Provider, MD  predniSONE (DELTASONE) 10 MG tablet Take 10 mg by mouth daily with breakfast.   Yes Historical Provider, MD  Acetaminophen-Caffeine (EXCEDRIN TENSION HEADACHE PO) Take 2 tablets by mouth once.     Historical Provider, MD  cefUROXime (CEFTIN) 500 MG tablet Take 1 tablet (500 mg total) by mouth 2 (two) times daily. 07/15/15   Frederich Cha, MD  cephALEXin (KEFLEX) 500 MG capsule Take 1 capsule (500 mg total) by mouth 3 (three) times daily. 02/16/14   Elnora Morrison, MD  loratadine (CLARITIN) 10 MG tablet Take 1 tablet (10 mg total) by mouth daily. Take 1 tablet in the morning. As needed for itching. 07/15/15   Frederich Cha, MD  ranitidine (ZANTAC) 150 MG capsule Take 1 capsule (150 mg total) by mouth 2 (two) times daily. As needed for the itching  07/15/15   Frederich Cha, MD   Meds Ordered and Administered this Visit  Medications - No data to display  BP 126/79 mmHg  Pulse 65  Temp(Src) 97 F (36.1 C) (Tympanic)  Resp 16  Ht 5\' 10"  (1.778 m)  Wt 283 lb (128.368 kg)  BMI 40.61 kg/m2  SpO2 100%  LMP 03/05/2015 No data found.   Physical Exam  Constitutional: She appears well-developed and well-nourished.  HENT:  Head: Normocephalic.  Eyes: Pupils are equal, round, and reactive to light.  Neck: Neck supple.  Cardiovascular: Normal rate and regular rhythm.   No murmur  heard. Pulmonary/Chest: Effort normal and breath sounds normal.  Neurological: She is alert.  Skin: Rash noted.     Patient has rash looks as this is a bad  folliculitis-concentrated on her chest neck and lower part of her face. The rash is lumped together and consists with a drug reaction which we expect all over her body. No signs of respiratory  Distress.  Psychiatric: She has a normal mood and affect.  Vitals reviewed.   ED Course  Procedures (including critical care time)  Labs Review Labs Reviewed  RAPID STREP SCREEN (NOT AT [facility])  CULTURE, GROUP A STREP ([facility] ONLY)    Imaging Review No results found.   Visual Acuity Review  Right Eye Distance:   Left Eye Distance:   Bilateral Distance:    Right Eye Near:   Left Eye Near:    Bilateral Near:     Results for orders placed or performed during the hospital encounter of 07/15/15  Rapid strep screen  Result Value Ref Range   Streptococcus, Group A Screen (Direct) NEGATIVE NEGATIVE     MDM   1. Folliculitis   2. Skin infection, bacterial   3. 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 If she is not better by Monday definite Tuesday to call her PCP. Placed on Claritin 10 mg daily with Zantac twice a day for itching. She continues to prednisone but I think was unopposed prednisone with any antibiotic that aggravated the infection.    Frederich Cha, MD 07/15/15 [phone]

## 2015-07-15 NOTE — Discharge Instructions (Signed)
Antibiotic Medicine Antibiotic medicines are used to treat infections caused by bacteria. They work by hurting or killing the germs that are making you sick. HOW WILL MY MEDICINE BE PICKED? There are many kinds of antibiotic medicines. To help your doctor pick one, tell your doctor if:  You have any allergies.  You are pregnant or plan to get pregnant.  You are breastfeeding.  You are taking any medicines. These include over-the-counter medicines, prescription medicines, and herbal remedies.  You have a medical condition or problem. If you have questions about why your medicine was picked, ask. FOR HOW LONG SHOULD I TAKE MY MEDICINE? Take your medicine for as long as your doctor tells you to. Do not stop taking it when you feel better. If you stop taking it too soon:  You may start to feel sick again.  Your infection may get harder to treat.  New problems may develop. WHAT IF I MISS A DOSE? Try not to miss any doses of antibiotic medicine. If you miss a dose:  Take the dose as soon as you can.  If you are taking 2 doses a day, take the next dose in 5 to 6 hours.  If you are taking 3 or more doses a day, take the next dose in 2 to 4 hours. Then go back to the normal schedule. If you cannot take a missed dose, take the next dose on time. Then take the missed dose after you have taken all the doses as told by your doctor, as if you had one more dose left. DOES THIS MEDICINE AFFECT BIRTH CONTROL? Birth control pills may not work while you are on antibiotic medicines. If you are taking birth control pills, keep taking them as usual. Use a second form of birth control, such as a condom. Keep using the second form of birth control until you are finished with your current 1 month cycle of birth control pills. GET HELP IF:  You get worse.  You do not feel better a few days after starting the medicine.  You throw up (vomit).  There are white patches in your mouth.  You have new  joint pain after starting the medicine.  You have new muscle aches after starting the medicine.  You had a fever before starting the medicine, and it comes back.  You have any symptoms of an allergic reaction, such as an itchy rash. If this happens, stop taking the medicine. GET HELP RIGHT AWAY IF:  Your pee (urine) turns dark or becomes blood-colored.  Your skin turns yellow.  You bruise or bleed easily.  You have very bad watery poop (diarrhea) and cramps in your belly (abdomen).  You have a very bad headache.  You have signs of a very bad allergic reaction, such as:  Trouble breathing.  Wheezing.  Swelling of the lips, tongue, or face.  Fainting.  Blisters on the skin or in the mouth. If you have signs of a very bad allergic reaction, stop taking the antibiotic medicine right away.   This information is not intended to replace advice given to you by your health care provider. Make sure you discuss any questions you have with your health care provider.   Document Released: 04/29/2008 Document Revised: 04/11/2015 Document Reviewed: 12/06/2014 Elsevier Interactive Patient Education 2016 [employer].  Drug Rash A drug rash is a change in the color or texture of the skin that is caused by a drug. It can develop minutes, hours, or days after the  person takes the drug. CAUSES This condition is usually caused by a drug allergy. It can also be caused by exposure to sunlight after taking a drug that makes the skin sensitive to light. Drugs that commonly cause rashes include:  Penicillin.  Antibiotic medicines.  Medicines that treat seizures.  Medicines that treat cancer (chemotherapy).  Aspirin and other nonsteroidal anti-inflammatory drugs (NSAIDs).  Injectable dyes that contain iodine.  Insulin. SYMPTOMS Symptoms of this condition include:  Redness.  Tiny bumps.  Peeling.  Itching.  Itchy welts (hives).  Swelling. The rash may appear on a small area  of skin or all over the body. DIAGNOSIS To diagnose the condition, your health care provider will do a physical exam. He or she may also order tests to find out which drug caused the rash. Tests to find the cause of a rash include:  Skin tests.  Blood tests.  Drug challenge. For this test, you stop taking all of the drugs that you do not need to take, and then you start taking them again by adding back one of the drugs at a time. TREATMENT A drug rash may be treated with medicines, including:  Antihistamines. These may be given to relieve itching.  An NSAID. This may be given to reduce swelling and treat pain.  A steroid drug. This may be given to reduce swelling. The rash usually goes away when the person stops taking the drug that caused it. HOME CARE INSTRUCTIONS  Take medicines only as directed by your health care provider.  Let all of your health care providers know about any drug reactions you have had in the past.  If you have hives, take a cool shower or use a cool compress to relieve itchiness. SEEK MEDICAL CARE IF:  You have a fever.  Your rash is not going away.  Your rash gets worse.  Your rash comes back.  You have wheezing or coughing. SEEK IMMEDIATE MEDICAL CARE IF:  You start to have breathing problems.  You start to have shortness of breath.  You face or throat starts to swell.  You have severe weakness with dizziness or fainting.  You have chest pain.   This information is not intended to replace advice given to you by your health care provider. Make sure you discuss any questions you have with your health care provider.   Document Released: 08/28/2004 Document Revised: 08/11/2014 Document Reviewed: 05/17/2014 Elsevier Interactive Patient Education 2016 Else[employer] Inc.   Folliculitis Folliculitis is redness, soreness, and swelling (inflammation) of the hair follicles. This condition can occur anywhere on the body. People with weakened immune  systems, diabetes, or obesity have a greater risk of getting folliculitis. CAUSES  Bacterial infection. This is the most common cause.  Fungal infection.  Viral infection.  Contact with certain chemicals, especially oils and tars. Long-term folliculitis can result from bacteria that live in the nostrils. The bacteria may trigger multiple outbreaks of folliculitis over time. SYMPTOMS Folliculitis most commonly occurs on the scalp, thighs, legs, back, buttocks, and areas where hair is shaved frequently. An early sign of folliculitis is a small, white or yellow, pus-filled, itchy lesion (pustule). These lesions appear on a red, inflamed follicle. They are usually less than 0.2 inches (5 mm) wide. When there is an infection of the follicle that goes deeper, it becomes a boil or furuncle. A group of closely packed boils creates a larger lesion (carbuncle). Carbuncles tend to occur in hairy, sweaty areas of the body. DIAGNOSIS  Your caregiver  can usually tell what is wrong by doing a physical exam. A sample may be taken from one of the lesions and tested in a lab. This can help determine what is causing your folliculitis. TREATMENT  Treatment may include:  Applying warm compresses to the affected areas.  Taking antibiotic medicines orally or applying them to the skin.  Draining the lesions if they contain a large amount of pus or fluid.  Laser hair removal for cases of long-lasting folliculitis. This helps to prevent regrowth of the hair. HOME CARE INSTRUCTIONS  Apply warm compresses to the affected areas as directed by your caregiver.  If antibiotics are prescribed, take them as directed. Finish them even if you start to feel better.  You may take over-the-counter medicines to relieve itching.  Do not shave irritated skin.  Follow up with your caregiver as directed. SEEK IMMEDIATE MEDICAL CARE IF:   You have increasing redness, swelling, or pain in the affected area.  You have a  fever. MAKE SURE YOU:  Understand these instructions.  Will watch your condition.  Will get help right away if you are not doing well or get worse.   This information is not intended to replace advice given to you by your health care provider. Make sure you discuss any questions you have with your health care provider.   Document Released: 09/29/2001 Document Revised: 08/11/2014 Document Reviewed: 10/21/2011 Elsevier Interactive Patient Education 2016 Elsevier Inc.  Rash A rash is a change in the color or feel of your skin. There are many different types of rashes. You may have other problems along with your rash. HOME CARE  Avoid the thing that caused your rash.  Do not scratch your rash.  You may take cools baths to help stop itching.  Only take medicines as told by your doctor.  Keep all doctor visits as told. GET HELP RIGHT AWAY IF:   Your pain, puffiness (swelling), or redness gets worse.  You have a fever.  You have new or severe problems.  You have body aches, watery poop (diarrhea), or you throw up (vomit).  Your rash is not better after 3 days. MAKE SURE YOU:   Understand these instructions.  Will watch your condition.  Will get help right away if you are not doing well or get worse.   This information is not intended to replace advice given to you by your health care provider. Make sure you discuss any questions you have with your health care provider.   Document Released: 01/07/2008 Document Revised: 10/13/2011 Document Reviewed: 12/06/2014 Elsevier Interactive Patient Education [employer].

## 2015-07-17 LAB — CULTURE, GROUP A STREP (THRC)

## 2015-10-08 LAB — HM PAP SMEAR: HM Pap smear: NEGATIVE

## 2016-02-22 ENCOUNTER — Ambulatory Visit: Payer: Self-pay | Admitting: Family Medicine

## 2016-03-27 ENCOUNTER — Ambulatory Visit: Payer: Self-pay | Admitting: Family Medicine

## 2016-03-27 DIAGNOSIS — Z0289 Encounter for other administrative examinations: Secondary | ICD-10-CM

## 2016-09-29 ENCOUNTER — Ambulatory Visit: Payer: Self-pay | Admitting: Family Medicine

## 2016-09-29 DIAGNOSIS — Z0289 Encounter for other administrative examinations: Secondary | ICD-10-CM

## 2016-10-20 ENCOUNTER — Ambulatory Visit (INDEPENDENT_AMBULATORY_CARE_PROVIDER_SITE_OTHER): Payer: BC Managed Care – PPO | Admitting: Obstetrics & Gynecology

## 2016-10-20 ENCOUNTER — Encounter: Payer: Self-pay | Admitting: Obstetrics & Gynecology

## 2016-10-20 VITALS — BP 130/88 | HR 71 | Ht 70.0 in | Wt 290.0 lb

## 2016-10-20 DIAGNOSIS — N921 Excessive and frequent menstruation with irregular cycle: Secondary | ICD-10-CM

## 2016-10-20 NOTE — Progress Notes (Addendum)
HPI:      [name] is a 36 y.o. G0P0000 who LMP was Patient's last menstrual period was 10/19/2016., presents today for a problem visit.  She complains of amenorrhea and this has been due to PCOS fdor years and she used PROVERA to induce periods, until periods that became heavy and prolonged that  began several months ago and its severity is described as moderate.  She has regular periods every 30 days and they are associated with moderate menstrual cramping.  She has used the following for attempts at control: maxi pad and tampon.  Previous evaluation: none. Prior Diagnosis: PCOS. Previous Treatment: Provera to induce period every 3 mos.  She is not sexually active.  Contraception: none. Hx of STDs: none. She is premenopausal.  PMHx: She  has a past medical history of Patient denies medical problems and PCOS (polycystic ovarian syndrome). Also,  has a past surgical history that includes No past surgeries., family history includes Breast cancer in her mother; Heart disease in her father.,  reports that she has never smoked. She has never used smokeless tobacco. She reports that she does not drink alcohol or use drugs.  She has a current medication list which includes the following prescription(s): acetaminophen-caffeine, cefuroxime, cephalexin, loratadine, medroxyprogesterone, multivitamin with minerals, prednisone, and ranitidine. Also, has No Known Allergies.  Review of Systems  Constitutional: Negative for chills, fever and malaise/fatigue.  HENT: Negative for congestion, sinus pain and sore throat.   Eyes: Negative for blurred vision and pain.  Respiratory: Negative for cough and wheezing.   Cardiovascular: Negative for chest pain and leg swelling.  Gastrointestinal: Negative for abdominal pain, constipation, diarrhea, heartburn, nausea and vomiting.  Genitourinary: Negative for dysuria, frequency, hematuria and urgency.  Musculoskeletal: Negative for back pain, joint pain,  myalgias and neck pain.  Skin: Negative for itching and rash.  Neurological: Negative for dizziness, tremors and weakness.  Endo/Heme/Allergies: Does not bruise/bleed easily.  Psychiatric/Behavioral: Negative for depression. The patient is not nervous/anxious and does not have insomnia.     Objective: BP 130/88   Pulse 71   Ht 5\' 10"  (1.778 m)   Wt 290 lb (131.5 kg)   LMP 10/19/2016   BMI 41.61 kg/m  Physical Exam  Constitutional: She is oriented to person, place, and time. She appears well-developed and well-nourished. No distress.  Genitourinary: Rectum normal, vagina normal and uterus normal. Pelvic exam was performed with patient supine. There is no rash or lesion on the right labia. There is no rash or lesion on the left labia. Vagina exhibits no lesion. No bleeding in the vagina. Right adnexum does not display mass and does not display tenderness. Left adnexum does not display mass and does not display tenderness. Cervix does not exhibit motion tenderness, lesion, friability or polyp.   Uterus is mobile and midaxial. Uterus is not enlarged or exhibiting a mass.  HENT:  Head: Normocephalic and atraumatic. Head is without laceration.  Right Ear: Hearing normal.  Left Ear: Hearing normal.  Nose: No epistaxis.  No foreign bodies.  Mouth/Throat: Uvula is midline, oropharynx is clear and moist and mucous membranes are normal.  Eyes: Pupils are equal, round, and reactive to light.  Neck: Normal range of motion. Neck supple. No thyromegaly present.  Cardiovascular: Normal rate and regular rhythm.  Exam reveals no gallop and no friction rub.   No murmur heard. Pulmonary/Chest: Effort normal and breath sounds normal. No respiratory distress. She has no wheezes. Right breast exhibits no mass, no skin change  and no tenderness. Left breast exhibits no mass, no skin change and no tenderness.  Abdominal: Soft. Bowel sounds are normal. She exhibits no distension. There is no tenderness. There is  no rebound.  Musculoskeletal: Normal range of motion.  Neurological: She is alert and oriented to person, place, and time. No cranial nerve deficit.  Skin: Skin is warm and dry.  Psychiatric: She has a normal mood and affect. Judgment normal.  Vitals reviewed.   ASSESSMENT/PLAN:  dysfunctional uterine bleeding and menorrhagia Problem List Items Addressed This Visit    None    Visit Diagnoses    Menorrhagia with irregular cycle    -  Primary   Relevant Orders   US Transvaginal Non-OB     Likely hormonal and thus will decide on OCP type option.  Desires pregnancy at some point.  Barnett Applebaum, MD, [facility], [facility] 10/20/2016  3:39 PM

## 2016-10-20 NOTE — Patient Instructions (Signed)

## 2016-11-10 ENCOUNTER — Other Ambulatory Visit: Payer: Self-pay | Admitting: Obstetrics & Gynecology

## 2016-11-10 ENCOUNTER — Telehealth: Payer: Self-pay

## 2016-11-10 MED ORDER — MEDROXYPROGESTERONE ACETATE 10 MG PO TABS: 20.0000 mg | ORAL_TABLET | Freq: Every day | ORAL | 1 refills | Status: DC

## 2016-11-10 NOTE — Telephone Encounter (Signed)
Pt called.  Heavy bleeding. On 3/13 was started on Provera for 7d and is still bleeding heavily with unbearable cramping and has had accidentes.  Sched. foro u/s on 7/19 and wants to be sure she won't be bleeding for u/s.  Bleeding is unpredictable and she is unable to work.  Please call.  [phone].

## 2016-11-10 NOTE — Telephone Encounter (Signed)
Pt needs to know if its okay to have the U/S while bleeding, she says it has not slowed down any,and very heavy. She's  having to change her pad every 45min . Her u/s is scheduled for the 19th is there anything else she can try to stop the bleeding?

## 2016-11-10 NOTE — Telephone Encounter (Signed)
Pt aware.

## 2016-11-10 NOTE — Telephone Encounter (Signed)
We can do another round of Provera leading up to [place];  it is OK to be bleeding during [place]; I know she would like some relief so will call in medicine at 2 pills a day from now until the 19th.

## 2016-11-17 DIAGNOSIS — E282 Polycystic ovarian syndrome: Secondary | ICD-10-CM | POA: Insufficient documentation

## 2016-11-20 ENCOUNTER — Encounter: Payer: Self-pay | Admitting: Obstetrics & Gynecology

## 2016-11-20 ENCOUNTER — Ambulatory Visit (INDEPENDENT_AMBULATORY_CARE_PROVIDER_SITE_OTHER): Payer: BC Managed Care – PPO

## 2016-11-20 ENCOUNTER — Ambulatory Visit (INDEPENDENT_AMBULATORY_CARE_PROVIDER_SITE_OTHER): Payer: BC Managed Care – PPO | Admitting: Obstetrics & Gynecology

## 2016-11-20 VITALS — BP 130/82 | HR 73 | Ht 70.0 in | Wt 295.0 lb

## 2016-11-20 DIAGNOSIS — Z9189 Other specified personal risk factors, not elsewhere classified: Secondary | ICD-10-CM

## 2016-11-20 DIAGNOSIS — N921 Excessive and frequent menstruation with irregular cycle: Secondary | ICD-10-CM

## 2016-11-20 NOTE — Progress Notes (Signed)
HPI: Dysfunctional Uterine Bleeding Patient complains of irregular menses. She had been bleeding irregularly. She is now bleeding every off and on every few days and menses are lasting several days. She had gone 4 ywars without a period prior to that, with h/o obesity and PCOS.  She changes her pad or tampon every a few hours. Clots are min in size. Dysmenorrhea:mild, occurring throughout cycle. Cyclic symptoms include: none. Current contraception: none. History of infertility: no. History of abnormal Pap smear: no.   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 Also,  has a past surgical history that includes No past surgeries., family history includes Breast cancer (age of onset: 1) in her mother; Heart disease in her father; Hypertension in her paternal grandmother; Kidney cancer in her maternal grandmother.,  reports that she has never smoked. She has never used smokeless tobacco. She reports that she does not drink alcohol or use drugs.  She has a current medication list which includes the following prescription(s): acetaminophen-caffeine, cefuroxime, cephalexin, ketoconazole, loratadine, multivitamin with minerals, prednisone, ranitidine, and triamcinolone. Also, has No Known Allergies.  Review of Systems  Constitutional: Negative for chills, fever and malaise/fatigue.  HENT: Negative for congestion, sinus pain and sore throat.   Eyes: Negative for blurred vision and pain.  Respiratory: Negative for cough and wheezing.   Cardiovascular: Negative for chest pain and leg swelling.  Gastrointestinal: Negative for abdominal pain, constipation, diarrhea, heartburn, nausea and vomiting.  Genitourinary: Negative for dysuria, frequency,  hematuria and urgency.  Musculoskeletal: Negative for back pain, joint pain, myalgias and neck pain.  Skin: Negative for itching and rash.  Neurological: Negative for dizziness, tremors and weakness.  Endo/Heme/Allergies: Does not bruise/bleed easily.  Psychiatric/Behavioral: Negative for depression. The patient is not nervous/anxious and does not have insomnia.     Objective: BP 130/82   Pulse 73   Ht 5\' 10"  (1.778 m)   Wt 295 lb (133.8 kg)   LMP 10/14/2016   BMI 42.33 kg/m   Physical examination Constitutional NAD, Conversant  Skin No rashes, lesions or ulceration.   Extremities: Moves all appropriately.  Normal ROM for age. No lymphadenopathy.  Neuro: Grossly intact  Psych: Oriented to PPT.  Normal mood. Normal affect.   Assessment:  Menorrhagia with irregular cycle  Increased risk of breast cancer  Options d/w pt, considering OCP but wishes to wait for now. Patient's irregular menses are most likely of an anovulatory etiology.  Differential diagnoses include PCOS, thyroid dysfunction,  elevated prolactin dysfunction, stress, significant weight loss, exercise or anything can disturb the hypothalamic-pituitary-ovarian axis. Patient was told to use a menstrual calendar for record keeping.  Will follow up results and manage accordingly. The risks /benefits of OCPs have been explained to the patient in detail.  Product literature has been given to her.  I have instructed her in the use of OCPs and have given her literature reinforcing this information.  I have explained to the patient that OCPs are not as effective for birth control during the first month of use, and that another form of contraception should be used during this time.  Both first-day start and Sunday start have been explained.  The risks and benefits of each was discussed.  She has been made aware of  the fact that other medications may affect  the efficacy of OCPs.  I have answered all of her questions, and I believe that  she has an understanding of the effectiveness and use of OCPs.    Barnett Applebaum, MD, [facility], [facility] 11/20/2016  4:16 PM

## 2017-02-03 ENCOUNTER — Inpatient Hospital Stay
Admission: RE | Admit: 2017-02-03 | Discharge: 2017-02-03 | Disposition: A | Discharge status: Home / Self Care | Payer: Self-pay | Source: Ambulatory Visit | Attending: *Deleted | Admitting: *Deleted

## 2017-02-03 ENCOUNTER — Other Ambulatory Visit: Payer: Self-pay | Admitting: *Deleted

## 2017-02-03 DIAGNOSIS — Z9289 Personal history of other medical treatment: Secondary | ICD-10-CM

## 2017-06-02 DIAGNOSIS — Z803 Family history of malignant neoplasm of breast: Secondary | ICD-10-CM | POA: Insufficient documentation

## 2017-06-02 DIAGNOSIS — Z6841 Body Mass Index (BMI) 40.0 and over, adult: Secondary | ICD-10-CM | POA: Insufficient documentation

## 2017-06-02 DIAGNOSIS — E282 Polycystic ovarian syndrome: Secondary | ICD-10-CM | POA: Insufficient documentation

## 2017-10-25 ENCOUNTER — Encounter (HOSPITAL_COMMUNITY): Payer: Self-pay | Admitting: *Deleted

## 2017-10-25 ENCOUNTER — Other Ambulatory Visit: Payer: Self-pay

## 2017-10-25 ENCOUNTER — Emergency Department (HOSPITAL_COMMUNITY)
Admission: EM | Admit: 2017-10-25 | Discharge: 2017-10-25 | Disposition: A | Discharge status: Home / Self Care | Payer: BC Managed Care – PPO | Attending: Emergency Medicine | Admitting: Emergency Medicine

## 2017-10-25 DIAGNOSIS — J029 Acute pharyngitis, unspecified: Secondary | ICD-10-CM | POA: Diagnosis present

## 2017-10-25 DIAGNOSIS — J020 Streptococcal pharyngitis: Secondary | ICD-10-CM | POA: Diagnosis not present

## 2017-10-25 DIAGNOSIS — Z79899 Other long term (current) drug therapy: Secondary | ICD-10-CM | POA: Insufficient documentation

## 2017-10-25 DIAGNOSIS — I10 Essential (primary) hypertension: Secondary | ICD-10-CM | POA: Diagnosis not present

## 2017-10-25 LAB — RAPID STREP SCREEN (MED CTR MEBANE ONLY): Streptococcus, Group A Screen (Direct): POSITIVE — AB

## 2017-10-25 MED ORDER — ACETAMINOPHEN 325 MG PO TABS
650.0000 mg | ORAL_TABLET | Freq: Once | ORAL | Status: AC | PRN
  Administered 2017-10-25: 650 mg via ORAL
  Filled 2017-10-25: qty 2

## 2017-10-25 MED ORDER — PENICILLIN G BENZATHINE 1200000 UNIT/2ML IM SUSP
1.2000 10*6.[IU] | Freq: Once | INTRAMUSCULAR | Status: AC
  Administered 2017-10-25: 1.2 10*6.[IU] via INTRAMUSCULAR
  Filled 2017-10-25: qty 2

## 2017-10-25 NOTE — ED Triage Notes (Signed)
Pt with sore throat, headache, last night head stuffy, fever this morning. No N/V/D feels a little light headed and dizzy with movement.

## 2017-10-25 NOTE — ED Provider Notes (Signed)
[facility] DEPT Provider Note   CSN: [id number] Arrival date & time: 10/25/17  5462     History   Chief Complaint Chief Complaint  Patient presents with  . flu like symptoms    HPI Hailey Harris is a 37 y.o. female.  The history is provided by the patient. No language interpreter was used.    Hailey Harris is a 37 y.o. female who presents to the Emergency Department complaining of sore throat. Two days ago she developed a global sore throat, slightly greater on the right compared to the left. Last night she developed fever at home. She has associated nasal congestion. No cough, vomiting, diarrhea. She is able to swallow liquids without difficulty but she states there is pain associated with this. She has no medical problems and takes no medications. Symptoms are moderate and constant in nature.  Past Medical History:  Diagnosis Date  . Amenorrhea, secondary   . Hirsutism   . Hypertension   . Infertility, female   . LGSIL on Pap smear of cervix 01/2005  . Patient denies medical problems   . PCOS (polycystic ovarian syndrome)   . Pre-diabetes     Patient Active Problem List   Diagnosis Date Noted  . Increased risk of breast cancer 05/25/2014    Past Surgical History:  Procedure Laterality Date  . NO PAST SURGERIES       OB History    Gravida  0   Para  0   Term  0   Preterm  0   AB  0   Living        SAB  0   TAB  0   Ectopic  0   Multiple      Live Births               Home Medications    Prior to Admission medications   Medication Sig Start Date End Date Taking? Authorizing Provider  Acetaminophen-Caffeine (EXCEDRIN TENSION HEADACHE PO) Take 2 tablets by mouth once.     [provider]  cefUROXime (CEFTIN) 500 MG tablet Take 1 tablet (500 mg total) by mouth 2 (two) times daily. Patient not taking: Reported on 10/20/2016 07/15/15   Frederich Cha, MD  cephALEXin (KEFLEX) 500 MG capsule Take 1  capsule (500 mg total) by mouth 3 (three) times daily. Patient not taking: Reported on 10/20/2016 02/16/14   Elnora Morrison, MD  ketoconazole (NIZORAL) 2 % cream apply to affected area once daily 11/19/16   [provider]  loratadine (CLARITIN) 10 MG tablet Take 1 tablet (10 mg total) by mouth daily. Take 1 tablet in the morning. As needed for itching. Patient not taking: Reported on 10/20/2016 07/15/15   Frederich Cha, MD  Multiple Vitamin (MULITIVITAMIN WITH MINERALS) TABS Take 1 tablet by mouth daily.      [provider]  predniSONE (DELTASONE) 10 MG tablet Take 10 mg by mouth daily with breakfast.    [provider]  ranitidine (ZANTAC) 150 MG capsule Take 1 capsule (150 mg total) by mouth 2 (two) times daily. As needed for the itching Patient not taking: Reported on 10/20/2016 07/15/15   Frederich Cha, MD  triamcinolone (KENALOG) 0.025 % ointment apply TO SCALP AT BEDTIME 11/19/16   [provider]    Family History Family History  Problem Relation Age of Onset  . Breast cancer Mother 25       Triple Negative  . Heart disease Father   .  Kidney cancer Maternal Grandmother   . Hypertension Paternal Grandmother     Social History Social History   Tobacco Use  . Smoking status: Never Smoker  . Smokeless tobacco: Never Used  Substance Use Topics  . Alcohol use: No    Alcohol/week: 0.0 oz    Comment: occasional  . Drug use: No     Allergies   Patient has no known allergies.   Review of Systems Review of Systems  All other systems reviewed and are negative.    Physical Exam Updated Vital Signs BP (!) 145/98 (BP Location: Left Arm)   Pulse 100   Temp (!) 100.9 F (38.3 C) (Oral)   Resp 18   Ht 5\' 10"  (1.778 m)   Wt 128.8 kg (284 lb)   SpO2 97%   BMI 40.75 kg/m   Physical Exam  Constitutional: She is oriented to person, place, and time. She appears well-developed and well-nourished.  HENT:  Head: Normocephalic and atraumatic.    Right Ear: External ear normal.  Left Ear: External ear normal.  Mild erythema and edema in the posterior oropharynx. No PTA.   Neck: Neck supple.  Mild anterior cervical lymphadenopathy  Cardiovascular: Normal rate and regular rhythm.  No murmur heard. Pulmonary/Chest: Effort normal and breath sounds normal. No stridor. No respiratory distress.  Musculoskeletal: She exhibits no edema or tenderness.  Neurological: She is alert and oriented to person, place, and time.  Skin: Skin is warm and dry.  Psychiatric: She has a normal mood and affect. Her behavior is normal.  Nursing note and vitals reviewed.    ED Treatments / Results  Labs (all labs ordered are listed, but only abnormal results are displayed) Labs Reviewed  RAPID STREP SCREEN (NOT AT [facility]) - Abnormal; Notable for the following components:      Result Value   Streptococcus, Group A Screen (Direct) POSITIVE (*)    All other components within normal limits    EKG None  Radiology No results found.  Procedures Procedures (including critical care time)  Medications Ordered in ED Medications  penicillin g benzathine (BICILLIN LA) 1200000 UNIT/2ML injection 1.2 Million Units (has no administration in time range)  acetaminophen (TYLENOL) tablet 650 mg (650 mg Oral Given 10/25/17 1019)     Initial Impression / Assessment and Plan / ED Course  I have reviewed the triage vital signs and the nursing notes.  Pertinent labs & imaging results that were available during my care of the patient were reviewed by me and considered in my medical decision making (see chart for details).    Patient here for evaluation of fever, sore throat. She is non-toxic appearing on examination well hydrated. Examination does demonstrate pharyngitis. No concerning features for Peritonsillar abscess, RPA, epigotitis. Counseled pt on home care for strep pharyngitis with oral fluid hydration. Discuss Tylenol or ibuprofen for fever or body aches.  Discussed close return precautions for any new or concerning symptoms. Final Clinical Impressions(s) / ED Diagnoses   Final diagnoses:  Strep pharyngitis    ED Discharge Orders    None       Quintella Reichert, MD 10/25/17 1135

## 2019-03-24 ENCOUNTER — Encounter: Payer: Self-pay | Admitting: Neurology

## 2019-03-24 ENCOUNTER — Other Ambulatory Visit: Payer: Self-pay

## 2019-03-24 ENCOUNTER — Ambulatory Visit (INDEPENDENT_AMBULATORY_CARE_PROVIDER_SITE_OTHER): Payer: BC Managed Care – PPO | Admitting: Neurology

## 2019-03-24 VITALS — BP 142/91 | HR 74 | Ht 68.0 in | Wt 293.0 lb

## 2019-03-24 DIAGNOSIS — R351 Nocturia: Secondary | ICD-10-CM

## 2019-03-24 DIAGNOSIS — R0683 Snoring: Secondary | ICD-10-CM

## 2019-03-24 DIAGNOSIS — Z6841 Body Mass Index (BMI) 40.0 and over, adult: Secondary | ICD-10-CM

## 2019-03-24 DIAGNOSIS — R51 Headache: Secondary | ICD-10-CM | POA: Diagnosis not present

## 2019-03-24 DIAGNOSIS — R519 Headache, unspecified: Secondary | ICD-10-CM

## 2019-03-24 NOTE — Progress Notes (Signed)
Subjective:    Patient ID: Hailey Harris is a 38 y.o. female.  HPI     History:  Dear Dr. Pamala Hurry,   I saw your patient, Dominque Levandowski, upon your kind request in my sleep clinic today for initial consultation of her sleep disorder, in particular, concern for underlying obstructive sleep apnea.  The patient is unaccompanied today.  As you know, Ms. Sharlet Salina is a 38 year old right-handed woman with an underlying medical history of PCOS, prediabetes, hypertension, and morbid obesity with a BMI of over 40, who reports snoring and excessive daytime somnolence.  I reviewed your office note from 03/08/2019, which you kindly included.  Her Epworth sleepiness score is 6 out of 24, fatigue severity score is 31 out of 63.  She and her husband are hoping to start a family.  She has had trouble getting pregnant.  She has had trouble losing weight.  She has had some elevated blood pressure values without an actual diagnosis of hypertension.  She works as a Pharmacist, hospital, currently is working remotely.  Typically she has a 1 hour commute.  Bedtime is often late, between 12:30 AM and 1 AM and rise time around 530.  She is a non-smoker and does not drink alcohol on a regular basis, only occasionally and caffeine about twice a week in the form of coffee.  She is not aware of any family history of OSA.  She has significant nocturia about 3 times per average night and has woken up with a headache.  She has noticed an increase in migrainous headaches especially with sleep deprivation and with stress.  She had a recent brain MRI and she brought a copy of the report today, her brain MRI was ordered by her PCP.  She had it through the [facility] system on 01/17/2019, brain MRI with and without contrast was essentially age-appropriate with scattered white matter changes noted.  Occasionally, she has had trouble falling asleep.  She has taken melatonin at times, up to 10 mg at night which has been helpful.    Her Past Medical History  Is Significant For: Past Medical History:  Diagnosis Date  . Amenorrhea, secondary   . Hirsutism   . Hypertension   . Infertility, female   . LGSIL on Pap smear of cervix 01/2005  . Patient denies medical problems   . PCOS (polycystic ovarian syndrome)   . Pre-diabetes     Her Past Surgical History Is Significant For: Past Surgical History:  Procedure Laterality Date  . NO PAST SURGERIES      Her Family History Is Significant For: Family History  Problem Relation Age of Onset  . Breast cancer Mother 62       Triple Negative  . Heart disease Father   . Kidney cancer Maternal Grandmother   . Diabetes Maternal Grandmother   . Hypertension Paternal Grandmother     Her Social History Is Significant For: Social History   Socioeconomic History  . Marital status: Single    Spouse name: Not on file  . Number of children: Not on file  . Years of education: Not on file  . Highest education level: Not on file  Occupational History  . Not on file  Social Needs  . Financial resource strain: Not on file  . Food insecurity    Worry: Not on file    Inability: Not on file  . Transportation needs    Medical: Not on file    Non-medical: Not on file  Tobacco Use  .  Smoking status: Never Smoker  . Smokeless tobacco: Never Used  Substance and Sexual Activity  . Alcohol use: No    Alcohol/week: 0.0 standard drinks    Comment: occasional  . Drug use: No  . Sexual activity: Yes    Birth control/protection: None  Lifestyle  . Physical activity    Days per week: Not on file    Minutes per session: Not on file  . Stress: Not on file  Relationships  . Social Herbalist on phone: Not on file    Gets together: Not on file    Attends religious service: Not on file    Active member of club or organization: Not on file    Attends meetings of clubs or organizations: Not on file    Relationship status: Not on file  Other Topics Concern  . Not on file  Social History  Narrative   ** Merged History Encounter **        Her Allergies Are:  No Known Allergies:   Her Current Medications Are:  Outpatient Encounter Medications as of 03/24/2019  Medication Sig  . metFORMIN (GLUCOPHAGE) 500 MG tablet Take 500 mg by mouth 3 (three) times daily.  . [DISCONTINUED] Acetaminophen-Caffeine (EXCEDRIN TENSION HEADACHE PO) Take 2 tablets by mouth once.   . [DISCONTINUED] cefUROXime (CEFTIN) 500 MG tablet Take 1 tablet (500 mg total) by mouth 2 (two) times daily. (Patient not taking: Reported on 10/20/2016)  . [DISCONTINUED] cephALEXin (KEFLEX) 500 MG capsule Take 1 capsule (500 mg total) by mouth 3 (three) times daily. (Patient not taking: Reported on 10/20/2016)  . [DISCONTINUED] ketoconazole (NIZORAL) 2 % cream apply to affected area once daily  . [DISCONTINUED] Liraglutide -Weight Management (SAXENDA) 18 MG/3ML SOPN Inject into the skin.  . [DISCONTINUED] loratadine (CLARITIN) 10 MG tablet Take 1 tablet (10 mg total) by mouth daily. Take 1 tablet in the morning. As needed for itching. (Patient not taking: Reported on 10/20/2016)  . [DISCONTINUED] Multiple Vitamin (MULITIVITAMIN WITH MINERALS) TABS Take 1 tablet by mouth daily.    . [DISCONTINUED] predniSONE (DELTASONE) 10 MG tablet Take 10 mg by mouth daily with breakfast.  . [DISCONTINUED] ranitidine (ZANTAC) 150 MG capsule Take 1 capsule (150 mg total) by mouth 2 (two) times daily. As needed for the itching (Patient not taking: Reported on 10/20/2016)  . [DISCONTINUED] triamcinolone (KENALOG) 0.025 % ointment apply TO SCALP AT BEDTIME   No facility-administered encounter medications on file as of 03/24/2019.   :  Review of Systems:  Out of a complete 14 point review of systems, all are reviewed and negative with the exception of these symptoms as listed below:  Review of Systems  Neurological:       Pt presents today to discuss her sleep. Pt has never had a sleep study but does endorse snoring.  Epworth Sleepiness  Scale 0= would never doze 1= slight chance of dozing 2= moderate chance of dozing 3= high chance of dozing  Sitting and reading: 3 Watching TV: 1 Sitting inactive in a public place (ex. Theater or meeting): 0 As a passenger in a car for an hour without a break: 0 Lying down to rest in the afternoon: 2 Sitting and talking to someone: 0 Sitting quietly after lunch (no alcohol): 0 In a car, while stopped in traffic: 0 Total: 6     Objective:  Neurological Exam  Physical Exam Physical Examination:   Vitals:   03/24/19 1336  BP: (!) 142/91  Pulse: 74  General Examination: The patient is a very pleasant 38 y.o. female in no acute distress. She appears well-developed and well-nourished and well groomed.   HEENT: Normocephalic, atraumatic, pupils are equal, round and reactive to light and accommodation. Extraocular tracking is good without limitation to gaze excursion or nystagmus noted. Normal smooth pursuit is noted. Hearing is grossly intact. Face is symmetric with normal facial animation and normal facial sensation. Speech is clear with no dysarthria noted. There is no hypophonia. There is no lip, neck/head, jaw or voice tremor. Neck is supple with full range of passive and active motion. There are no carotid bruits on auscultation. Oropharynx exam reveals: mild mouth dryness, good dental hygiene and moderate airway crowding, due to Small airway, tonsils of about 1-2+, Mallampati is class III.  Tongue protrudes centrally in palate elevates symmetrically.  Neck circumference is 16-1/2 inches.  She has a mild to moderate overbite peer  Chest: Clear to auscultation without wheezing, rhonchi or crackles noted.  Heart: S1+S2+0, regular and normal without murmurs, rubs or gallops noted.   Abdomen: Soft, non-tender and non-distended with normal bowel sounds appreciated on auscultation.  Extremities: There is no pitting edema in the distal lower extremities bilaterally. Pedal pulses are  intact.  Skin: Warm and dry without trophic changes noted. There are no varicose veins.  Musculoskeletal: exam reveals no obvious joint deformities, tenderness or joint swelling or erythema.   Neurologically:  Mental status: The patient is awake, alert and oriented in all 4 spheres. Her immediate and remote memory, attention, language skills and fund of knowledge are appropriate. There is no evidence of aphasia, agnosia, apraxia or anomia. Speech is clear with normal prosody and enunciation. Thought process is linear. Mood is normal and affect is normal.  Cranial nerves II - XII are as described above under HEENT exam. Motor exam: Normal bulk, strength and tone is noted. There is no drift, tremor or rebound. Romberg is negative. Fine motor skills and coordination: grossly intact.  Cerebellar testing: No dysmetria or intention tremor. There is no truncal or gait ataxia.  Sensory exam: intact to light touch in the upper and lower extremities.  Gait, station and balance: She stands easily. No veering to one side is noted. No leaning to one side is noted. Posture is age-appropriate and stance is narrow based. Gait shows normal stride length and normal pace. No problems turning are noted. tandem walk is unremarkable.  Assessment and Plan:  In summary, Darriel Sinquefield is a very pleasant 38 y.o.-year old female with an underlying medical history of PCOS, prediabetes, hypertension, and morbid obesity with a BMI of over 40, whose history and physical exam are concerning for obstructive sleep apnea (OSA). I had a long chat with the patient about my findings and the diagnosis of OSA, its prognosis and treatment options. We talked about medical treatments, surgical interventions and non-pharmacological approaches. I explained in particular the risks and ramifications of untreated moderate to severe OSA, especially with respect to developing cardiovascular disease down the Road, including congestive heart  failure, difficult to treat hypertension, cardiac arrhythmias, or stroke. Even type 2 diabetes has, in part, been linked to untreated OSA. Symptoms of untreated OSA include daytime sleepiness, memory problems, mood irritability and mood disorder such as depression and anxiety, lack of energy, as well as recurrent headaches, especially morning headaches. We talked about trying to maintain a healthy lifestyle in general, as well as the importance of weight control. I encouraged the patient to eat healthy, exercise daily and keep  well hydrated, to keep a scheduled bedtime and wake time routine, to not skip any meals and eat healthy snacks in between meals. I advised the patient not to drive when feeling sleepy. I recommended the following at this time: sleep study.   I explained the sleep test procedure to the patient and also outlined possible surgical and non-surgical treatment options of OSA, including the use of a custom-made dental device (which would require a referral to a specialist dentist or oral surgeon), upper airway surgical options, such as pillar implants, radiofrequency surgery, tongue base surgery, and UPPP (which would involve a referral to an ENT surgeon). Rarely, jaw surgery such as mandibular advancement may be considered.  I also explained the CPAP treatment option to the patient, who indicated that she would be willing to try CPAP if the need arises. I explained the importance of being compliant with PAP treatment, not only for insurance purposes but primarily to improve Her symptoms, and for the patient's long term health benefit, including to reduce Her cardiovascular risks. I answered all her questions today and the patient was in agreement. I would like to see her back after the sleep study is completed and encouraged her to call with any interim questions, concerns, problems or updates.   Thank you very much for allowing me to participate in the care of this nice patient. If I can be  of any further assistance to you please do not hesitate to call me at [phone].  Sincerely,   Star Age, MD, PhD

## 2019-03-24 NOTE — Patient Instructions (Signed)

## 2019-04-12 ENCOUNTER — Ambulatory Visit (INDEPENDENT_AMBULATORY_CARE_PROVIDER_SITE_OTHER): Payer: BC Managed Care – PPO | Admitting: Neurology

## 2019-04-12 DIAGNOSIS — G4733 Obstructive sleep apnea (adult) (pediatric): Secondary | ICD-10-CM | POA: Diagnosis not present

## 2019-04-12 DIAGNOSIS — R0683 Snoring: Secondary | ICD-10-CM

## 2019-04-12 DIAGNOSIS — R351 Nocturia: Secondary | ICD-10-CM

## 2019-04-12 DIAGNOSIS — Z6841 Body Mass Index (BMI) 40.0 and over, adult: Secondary | ICD-10-CM

## 2019-04-12 DIAGNOSIS — G4720 Circadian rhythm sleep disorder, unspecified type: Secondary | ICD-10-CM

## 2019-04-12 DIAGNOSIS — R519 Headache, unspecified: Secondary | ICD-10-CM

## 2019-04-19 NOTE — Procedures (Signed)
PATIENT'S NAME:  Hailey Harris, Hailey Harris DOB:      20-Oct-1980      MR#:    [id number]     DATE OF RECORDING: 04/12/2019 REFERRING M.D.:  Aloha Gell, MD Study Performed:   Baseline Polysomnogram HISTORY: 38 year old woman with a history of PCOS, prediabetes, hypertension, and morbid obesity with a BMI of over 40, who reports snoring and excessive daytime somnolence. The patient endorsed the Epworth Sleepiness Scale at 6 points. The patient's weight 293 pounds with a height of 68 (inches), resulting in a BMI of 44.4 kg/m2. The patient's neck circumference measured 16.5 inches.  CURRENT MEDICATIONS: Metformin   PROCEDURE:  This is a multichannel digital polysomnogram utilizing the Somnostar 11.2 system.  Electrodes and sensors were applied and monitored per AASM Specifications.   EEG, EOG, Chin and Limb EMG, were sampled at 200 Hz.  ECG, Snore and Nasal Pressure, Thermal Airflow, Respiratory Effort, CPAP Flow and Pressure, Oximetry was sampled at 50 Hz. Digital video and audio were recorded.      BASELINE STUDY  Lights Out was at 23:00 and Lights On at 05:01.  Total recording time (TRT) was 361 minutes, with a total sleep time (TST) of 221.5 minutes.   The patient's sleep latency was 28.5 minutes, which is mildly delayed.  REM latency was 76.5 minutes, which is normal. The sleep efficiency was 61.4%, which is reduced.     SLEEP ARCHITECTURE: WASO (Wake after sleep onset) was 24 minutes with overall mild sleep fragmentation noted, but inability to go back to sleep after 03:34. There were 15 minutes in Stage N1, 113 minutes Stage N2, 57.5 minutes Stage N3 and 36 minutes in Stage REM.  The percentage of Stage N1 was 6.8%, Stage N2 was 51.%, which normal, Stage N3 was 26.%, which is mildly increased, and Stage R (REM sleep) was 16.3%, which is mildly reduced.  RESPIRATORY ANALYSIS:  There were a total of 7 respiratory events:  0 obstructive apneas, 0 central apneas and 0 mixed apneas with a total of 0 apneas  and an apnea index (AI) of 0 /hour. There were 7 hypopneas with a hypopnea index of 1.9 /hour. The patient also had 0 respiratory event related arousals (RERAs).      The total APNEA/HYPOPNEA INDEX (AHI) was 1.9 /hour and the total RESPIRATORY DISTURBANCE INDEX was 0. 1.9 /hour.  4 events occurred in REM sleep and 6 events in NREM. The REM AHI was 6.7 /hour, versus a non-REM AHI of 1.. The patient spent 0 minutes of total sleep time in the supine position and 222 minutes in non-supine.. The supine AHI was 0.0 versus a non-supine AHI of 1.9.  OXYGEN SATURATION & C02:  The Wake baseline 02 saturation was 96%, with the lowest being 87%. Time spent below 89% saturation equaled 0 minutes.   PERIODIC LIMB MOVEMENTS: The patient had a total of 0 Periodic Limb Movements.  The Periodic Limb Movement (PLM) index was 0 and the PLM Arousal index was 0/hour. The arousals were noted as: 13 were spontaneous, 0 were associated with PLMs, 2 were associated with respiratory events. Audio and video analysis did not show any abnormal or unusual movements, behaviors, phonations or vocalizations. The patient took 1 bathroom break, after which she was unable to go back to sleep. Mild to moderate snoring was noted. The EKG was in keeping with normal sinus rhythm (NSR).  Post-study, the patient indicated that sleep was the same as usual.   IMPRESSION:  1. Primary Snoring 2. Dysfunctions  associated with sleep stages or arousal from sleep  RECOMMENDATIONS:  1. This study does not demonstrate any significant obstructive or central sleep disordered breathing, except mild to moderate snoring and borderline REM related sleep apnea. Weight loss will likely help these issues; CPAP therapy is not warranted. The absence of supine sleep may underestimate her sleep disordered breathing; however, she indicated being a side sleeper at home. This study does not support an intrinsic sleep disorder as a cause of the patient's symptoms.  Other causes, including circadian rhythm disturbances, an underlying mood disorder, medication effect and/or an underlying medical problem cannot be ruled out. 2. This study shows sleep fragmentation and mildly abnormal sleep stage percentages; these are nonspecific findings and per se do not signify an intrinsic sleep disorder or a cause for the patient's sleep-related symptoms. Causes include (but are not limited to) the first night effect of the sleep study, circadian rhythm disturbances, medication effect or an underlying mood disorder or medical problem.  3. The patient should be cautioned not to drive, work at heights, or operate dangerous or heavy equipment when tired or sleepy. Review and reiteration of good sleep hygiene measures should be pursued with any patient. 4. The patient will be advised to follow up with the referring provider, who will be notified of the test results.  I certify that I have reviewed the entire raw data recording prior to the issuance of this report in accordance with the Standards of Accreditation of the American Academy of Sleep Medicine (AASM)   Star Age, MD, PhD Diplomat, American Board of Neurology and Sleep Medicine (Neurology and Sleep Medicine)

## 2019-04-19 NOTE — Progress Notes (Signed)
Patient referred by Dr. Pamala Hurry, seen by me on 03/24/19, diagnostic PSG on 04/12/19.   Please call and notify the patient that the recent sleep study did not show any significant obstructive or central sleep disordered breathing, except mild to moderate snoring and borderline REM related sleep apnea. Weight loss will likely help these issues; CPAP therapy is not warranted. The absence of supine sleep may underestimate her sleep disordered breathing; however, she indicated being a side sleeper at home.  She can FU with her referring provider and PCP at this point.  Please remind patient to try to maintain good sleep hygiene, which means: Keep a regular sleep and wake schedule and make enough time for sleep (7 1/2 to 8 1/2 hours for the average adult), try not to exercise or have a meal within 2 hours of your bedtime, try to keep your bedroom conducive for sleep, that is, cool and dark, without light distractors such as an illuminated alarm clock, and refrain from watching TV right before sleep or in the middle of the night and do not keep the TV or radio on during the night. If a nightlight is used, have it away from the visual field. Also, try not to use or play on electronic devices at bedtime, such as your cell phone, tablet PC or laptop. If you like to read at bedtime on an electronic device, try to dim the background light as much as possible. Do not eat in the middle of the night. Keep pets away from the bedroom environment. For stress relief, try meditation, deep breathing exercises (there are many books and CDs available), a white noise machine or fan can help to diffuse other noise distractors, such as traffic noise. Do not drink alcohol before bedtime, as it can disturb sleep and cause middle of the night awakenings. Never mix alcohol and sedating medications! Avoid narcotic pain medication close to bedtime, as opioids/narcotics can suppress breathing drive and breathing effort.    Thanks,  Star Age, MD, PhD [facility])

## 2019-04-20 ENCOUNTER — Telehealth: Payer: Self-pay

## 2019-04-20 NOTE — Telephone Encounter (Signed)
-----   Message from Star Age, MD sent at 04/19/2019  8:26 AM EDT ----- Patient referred by Dr. Pamala Hurry, seen by me on 03/24/19, diagnostic PSG on 04/12/19.   Please call and notify the patient that the recent sleep study did not show any significant obstructive or central sleep disordered breathing, except mild to moderate snoring and borderline REM related sleep apnea. Weight loss will likely help these issues; CPAP therapy is not warranted. The absence of supine sleep may underestimate her sleep disordered breathing; however, she indicated being a side sleeper at home.  She can FU with her referring provider and PCP at this point.  Please remind patient to try to maintain good sleep hygiene, which means: Keep a regular sleep and wake schedule and make enough time for sleep (7 1/2 to 8 1/2 hours for the average adult), try not to exercise or have a meal within 2 hours of your bedtime, try to keep your bedroom conducive for sleep, that is, cool and dark, without light distractors such as an illuminated alarm clock, and refrain from watching TV right before sleep or in the middle of the night and do not keep the TV or radio on during the night. If a nightlight is used, have it away from the visual field. Also, try not to use or play on electronic devices at bedtime, such as your cell phone, tablet PC or laptop. If you like to read at bedtime on an electronic device, try to dim the background light as much as possible. Do not eat in the middle of the night. Keep pets away from the bedroom environment. For stress relief, try meditation, deep breathing exercises (there are many books and CDs available), a white noise machine or fan can help to diffuse other noise distractors, such as traffic noise. Do not drink alcohol before bedtime, as it can disturb sleep and cause middle of the night awakenings. Never mix alcohol and sedating medications! Avoid narcotic pain medication close to bedtime, as opioids/narcotics can  suppress breathing drive and breathing effort.    Thanks,  Star Age, MD, PhD [facility])

## 2019-04-20 NOTE — Telephone Encounter (Signed)
I called pt to discuss her sleep study results. No answer, left a message asking her to call me back. 

## 2019-04-20 NOTE — Telephone Encounter (Signed)
I called pt. I advised pt that Dr. Rexene Alberts reviewed pt's sleep study and found that she did not show any significant osa, had mild to moderate snoring with borderline REM osa. Dr. Rexene Alberts recommends that pt avoid supine sleep and to work on weight loss. I reviewed sleep hygiene recommendations with the pt, including trying to keep a regular sleep wake schedule, avoiding electronics in the bedroom, keeping the bedroom cool, dark, and quiet, and avoiding eating or exercising within 2 hours of bedtime as well as eating in the middle of the night. I advised pt to keep pets out of the bedroom. I discussed with pt the importance of stress relief and to try meditation, deep breathing exercises, and/or a white noise machine or fan to diffuse other noise distractors. I advised pt to not drink alcohol before bedtime and to never mix alcohol and sedating medications. Pt was advised to avoid narcotic pain medication close to bedtime. I advised pt that a copy of these sleep study results will be sent to Dr. Pamala Hurry. Pt verbalized understanding of results. Pt had no questions at this time but was encouraged to call back if questions arise.

## 2019-04-20 NOTE — Telephone Encounter (Signed)
Pt returned call. Please call back as soon as available.  °

## 2019-05-02 ENCOUNTER — Encounter (HOSPITAL_COMMUNITY): Payer: Self-pay

## 2019-05-02 ENCOUNTER — Emergency Department (HOSPITAL_COMMUNITY)
Admission: EM | Admit: 2019-05-02 | Discharge: 2019-05-03 | Disposition: A | Discharge status: Home / Self Care | Payer: BC Managed Care – PPO | Attending: Emergency Medicine | Admitting: Emergency Medicine

## 2019-05-02 ENCOUNTER — Other Ambulatory Visit: Payer: Self-pay

## 2019-05-02 DIAGNOSIS — Z7984 Long term (current) use of oral hypoglycemic drugs: Secondary | ICD-10-CM | POA: Insufficient documentation

## 2019-05-02 DIAGNOSIS — K8020 Calculus of gallbladder without cholecystitis without obstruction: Secondary | ICD-10-CM | POA: Diagnosis not present

## 2019-05-02 DIAGNOSIS — I10 Essential (primary) hypertension: Secondary | ICD-10-CM | POA: Diagnosis not present

## 2019-05-02 DIAGNOSIS — R1011 Right upper quadrant pain: Secondary | ICD-10-CM

## 2019-05-02 DIAGNOSIS — R109 Unspecified abdominal pain: Secondary | ICD-10-CM | POA: Diagnosis present

## 2019-05-02 LAB — URINALYSIS, ROUTINE W REFLEX MICROSCOPIC
Bacteria, UA: NONE SEEN
Bilirubin Urine: NEGATIVE
Glucose, UA: NEGATIVE mg/dL
Ketones, ur: NEGATIVE mg/dL
Leukocytes,Ua: NEGATIVE
Nitrite: NEGATIVE
Protein, ur: NEGATIVE mg/dL
Specific Gravity, Urine: 1.025 (ref 1.005–1.030)
pH: 5 (ref 5.0–8.0)

## 2019-05-02 LAB — CBC
HCT: 37.9 % (ref 36.0–46.0)
Hemoglobin: 12 g/dL (ref 12.0–15.0)
MCH: 27.1 pg (ref 26.0–34.0)
MCHC: 31.7 g/dL (ref 30.0–36.0)
MCV: 85.7 fL (ref 80.0–100.0)
Platelets: 333 10*3/uL (ref 150–400)
RBC: 4.42 MIL/uL (ref 3.87–5.11)
RDW: 15.4 % (ref 11.5–15.5)
WBC: 8.3 10*3/uL (ref 4.0–10.5)
nRBC: 0 % (ref 0.0–0.2)

## 2019-05-02 LAB — COMPREHENSIVE METABOLIC PANEL
ALT: 33 U/L (ref 0–44)
AST: 28 U/L (ref 15–41)
Albumin: 3.9 g/dL (ref 3.5–5.0)
Alkaline Phosphatase: 89 U/L (ref 38–126)
Anion gap: 11 (ref 5–15)
BUN: 12 mg/dL (ref 6–20)
CO2: 23 mmol/L (ref 22–32)
Calcium: 9.1 mg/dL (ref 8.9–10.3)
Chloride: 106 mmol/L (ref 98–111)
Creatinine, Ser: 0.96 mg/dL (ref 0.44–1.00)
GFR calc Af Amer: >60 mL/min (ref >60)
GFR calc non Af Amer: >60 mL/min (ref >60)
Glucose, Bld: 94 mg/dL (ref 70–99)
Potassium: 3.6 mmol/L (ref 3.5–5.1)
Sodium: 140 mmol/L (ref 135–145)
Total Bilirubin: 0.4 mg/dL (ref 0.3–1.2)
Total Protein: 7.6 g/dL (ref 6.5–8.1)

## 2019-05-02 LAB — I-STAT BETA HCG BLOOD, ED (MC, WL, AP ONLY): I-stat hCG, quantitative: <5 m[IU]/mL (ref <5)

## 2019-05-02 LAB — LIPASE, BLOOD: Lipase: 19 U/L (ref 11–51)

## 2019-05-02 MED ORDER — SODIUM CHLORIDE 0.9% FLUSH: 3.0000 mL | Freq: Once | INTRAVENOUS | Status: DC

## 2019-05-02 NOTE — ED Triage Notes (Signed)
Pt to ED via EMS from home, pt c/o right upper quad abdominal pain that started 30 mins ago, sudden onset.  Reports "feels like knot"

## 2019-05-02 NOTE — ED Provider Notes (Signed)
[facility] DEPT Provider Note   CSN: [id number] Arrival date & time: 05/02/19  1749     History   Chief Complaint Chief Complaint  Patient presents with  . Abdominal Pain    HPI Hailey Harris is a 38 y.o. female.     The history is provided by the patient and medical records.  Abdominal Pain    38 year old female with history of hirsutism, hypertension, PCOS, currently undergoing fertility treatment, presenting to the ED after an episode of right upper quadrant pain.  States this began about 30 minutes prior to arrival in ER.  States localized only to right upper abdomen and felt as if something was "twisting or stretching" inside.  She does report mild nausea and sweating but no vomiting or diarrhea.  She is not had any fever or chills.  No cough or upper respiratory symptoms.  She is currently on metformin and Femara prescribed by her OB/GYN, Dr. Valentino Saxon.  She is not had any issues with these medications.  Her last menstrual cycle ended yesterday.  Unfortunately, patient has had a prolonged wait, pain has resolved at this time.  States she is been walking around in the lobby and has not had any noted recurrence of her pain.  Past Medical History:  Diagnosis Date  . Amenorrhea, secondary   . Hirsutism   . Hypertension   . Infertility, female   . LGSIL on Pap smear of cervix 01/2005  . Patient denies medical problems   . PCOS (polycystic ovarian syndrome)   . Pre-diabetes     Patient Active Problem List   Diagnosis Date Noted  . Increased risk of breast cancer 05/25/2014    Past Surgical History:  Procedure Laterality Date  . NO PAST SURGERIES       OB History    Gravida  0   Para  0   Term  0   Preterm  0   AB  0   Living        SAB  0   TAB  0   Ectopic  0   Multiple      Live Births               Home Medications    Prior to Admission medications   Medication Sig Start Date End Date Taking?  Authorizing Provider  metFORMIN (GLUCOPHAGE) 500 MG tablet Take 500 mg by mouth 3 (three) times daily.    [provider]    Family History Family History  Problem Relation Age of Onset  . Breast cancer Mother 30       Triple Negative  . Heart disease Father   . Kidney cancer Maternal Grandmother   . Diabetes Maternal Grandmother   . Hypertension Paternal Grandmother     Social History Social History   Tobacco Use  . Smoking status: Never Smoker  . Smokeless tobacco: Never Used  Substance Use Topics  . Alcohol use: Yes    Alcohol/week: 0.0 standard drinks    Comment: occasional  . Drug use: No     Allergies   Patient has no known allergies.   Review of Systems Review of Systems  Gastrointestinal: Positive for abdominal pain.  All other systems reviewed and are negative.    Physical Exam Updated Vital Signs BP 125/83 (BP Location: Left Arm)   Pulse 60   Temp 98.4 F (36.9 C) (Oral)   Resp 18   Ht 5\' 8"  (1.727 m)  Wt 134.3 kg   LMP 05/01/2019   SpO2 98% Comment: Simultaneous filing. User may not have seen previous data.  BMI 45.01 kg/m   Physical Exam Vitals signs and nursing note reviewed.  Constitutional:      Appearance: She is well-developed.  HENT:     Head: Normocephalic and atraumatic.  Eyes:     Conjunctiva/sclera: Conjunctivae normal.     Pupils: Pupils are equal, round, and reactive to light.  Neck:     Musculoskeletal: Normal range of motion.  Cardiovascular:     Rate and Rhythm: Normal rate and regular rhythm.     Heart sounds: Normal heart sounds.  Pulmonary:     Effort: Pulmonary effort is normal.     Breath sounds: Normal breath sounds.  Abdominal:     General: Bowel sounds are normal.     Palpations: Abdomen is soft.     Tenderness: There is no abdominal tenderness. There is no rebound.     Comments: Soft, overall non-tender  Musculoskeletal: Normal range of motion.  Skin:    General: Skin is warm and dry.   Neurological:     Mental Status: She is alert and oriented to person, place, and time.      ED Treatments / Results  Labs (all labs ordered are listed, but only abnormal results are displayed) Labs Reviewed  URINALYSIS, ROUTINE W REFLEX MICROSCOPIC - Abnormal; Notable for the following components:      Result Value   APPearance HAZY (*)    Hgb urine dipstick LARGE (*)    All other components within normal limits  LIPASE, BLOOD  COMPREHENSIVE METABOLIC PANEL  CBC  I-STAT BETA HCG BLOOD, ED ([facility], [facility], [facility] ONLY)    EKG None  Radiology US Abdomen Limited Ruq  Result Date: 05/03/2019 CLINICAL DATA:  Right upper quadrant pain for 1 day EXAM: ULTRASOUND ABDOMEN LIMITED RIGHT UPPER QUADRANT COMPARISON:  None. FINDINGS: Gallbladder: Nonfasting exam with patient reportedly eating approximately 1 hour prior to the study. The gallbladder appears largely contracted around numerous echogenic, posteriorly shadowing gallstones. Large gallstone measuring up to 8 mm in size. No gallbladder wall thickening or pericholecystic inflammation. Sonographic Percell Miller sign is reportedly negative. Common bile duct: Diameter: 2 mm, nondilated Liver: No focal lesion identified. Within normal limits in parenchymal echogenicity. Portal vein is patent on color Doppler imaging with normal direction of blood flow towards the liver. Other: None. IMPRESSION: Nonfasting examination. Cholelithiasis without evidence of acute cholecystitis. Electronically Signed   By: Lovena Le M.D.   On: 05/03/2019 00:45    Procedures Procedures (including critical care time)  Medications Ordered in ED Medications  sodium chloride flush (NS) 0.9 % injection 3 mL (0 mLs Intravenous Hold 05/02/19 2350)     Initial Impression / Assessment and Plan / ED Course  I have reviewed the triage vital signs and the nursing notes.  Pertinent labs & imaging results that were available during my care of the patient were reviewed by me and  considered in my medical decision making (see chart for details).  38 year old female presenting to the ED after an episode of right upper quadrant pain.  This resolved prior to my evaluation.  She denies history of same.  She is currently undergoing fertility treatment with metformin and Femara.  She has history of PCOS.  She is afebrile and nontoxic.  She does not have any focal tenderness on exam.  Her labs are overall reassuring.  Right upper quadrant ultrasound was obtained and reveals  cholelithiasis without findings of acute cholecystitis.  Patient remains asymptomatic here in the ER.  Feel she is stable for discharge home.  We have discussed diet modifications including limiting fatty/greasy foods, follow-up with general surgery for any worsening/prolonged symptoms.  Will return here for acute changes or new concerns.  Final Clinical Impressions(s) / ED Diagnoses   Final diagnoses:  RUQ pain  Calculus of gallbladder without cholecystitis without obstruction    ED Discharge Orders         Ordered    HYDROcodone-acetaminophen (NORCO/VICODIN) 5-325 MG tablet  Every 4 hours PRN     05/03/19 0056    ondansetron (ZOFRAN ODT) 4 MG disintegrating tablet  Every 8 hours PRN     05/03/19 0056           Larene Pickett, PA-C 05/03/19 0100    Merryl Hacker, MD 05/03/19 [phone]

## 2019-05-02 NOTE — ED Notes (Signed)
TEXTING ON HER PHONE

## 2019-05-03 ENCOUNTER — Emergency Department (HOSPITAL_COMMUNITY): Payer: BC Managed Care – PPO

## 2019-05-03 MED ORDER — ONDANSETRON 4 MG PO TBDP: 4.0000 mg | ORAL_TABLET | Freq: Three times a day (TID) | ORAL | 0 refills | Status: DC | PRN

## 2019-05-03 MED ORDER — HYDROCODONE-ACETAMINOPHEN 5-325 MG PO TABS: 1.0000 | ORAL_TABLET | ORAL | 0 refills | Status: DC | PRN

## 2019-05-03 NOTE — Discharge Instructions (Signed)
As we discussed, your ultrasound today did show gallstones. Recommend to try to follow diet low in processed, fatty/greasy food.  See attached for more information. Follow-up with general surgery should symptoms worsen or become very frequent. Return to the ED for new or worsening symptoms--high fever, uncontrolled vomiting, diarrhea, severe pain, etc.

## 2020-07-05 IMAGING — US US ABDOMEN LIMITED
1 series · 14 of 25 positions shown · non-contrast
Comparison: None.

CLINICAL DATA: Right upper quadrant pain for 1 day

EXAM:
ULTRASOUND ABDOMEN LIMITED RIGHT UPPER QUADRANT

[Series 1: us abdomen limited · 14 of 44 slices shown]
[im 1/44]
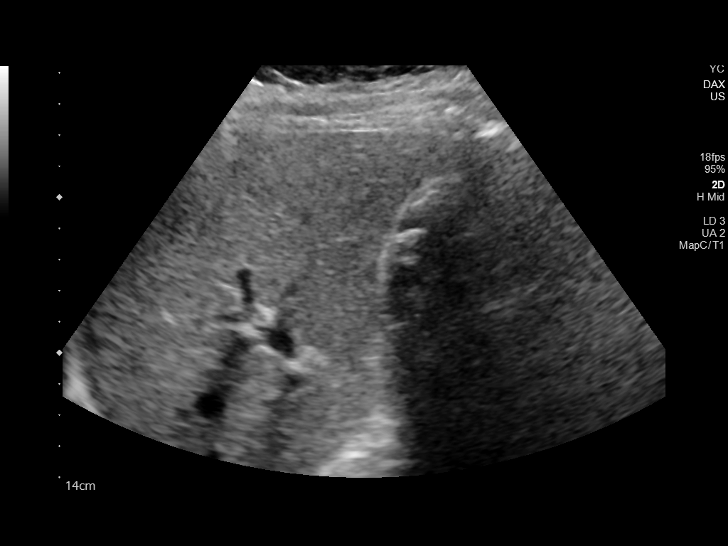
[im 4/44]
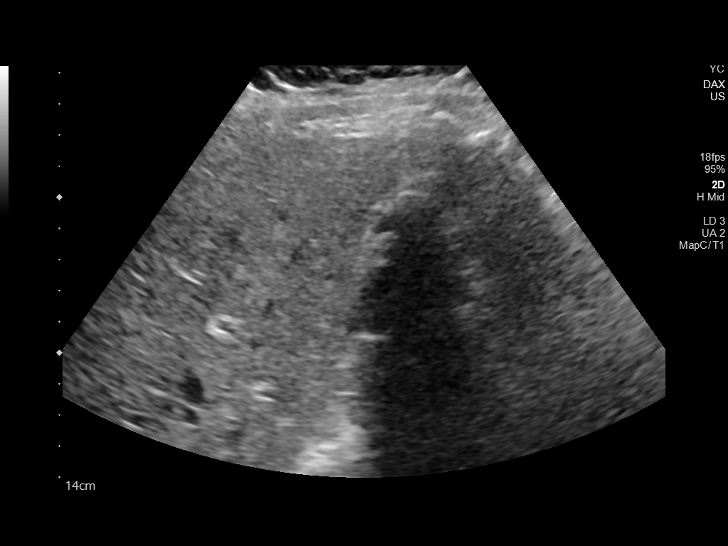
[im 8/44]
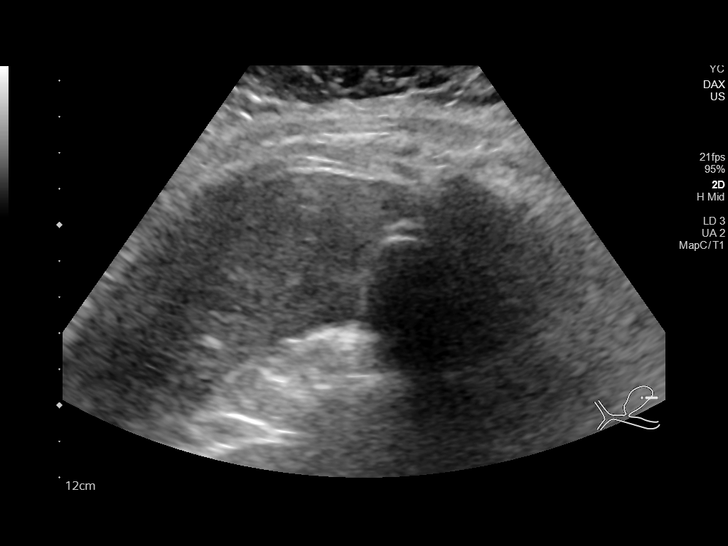
[im 11/44]
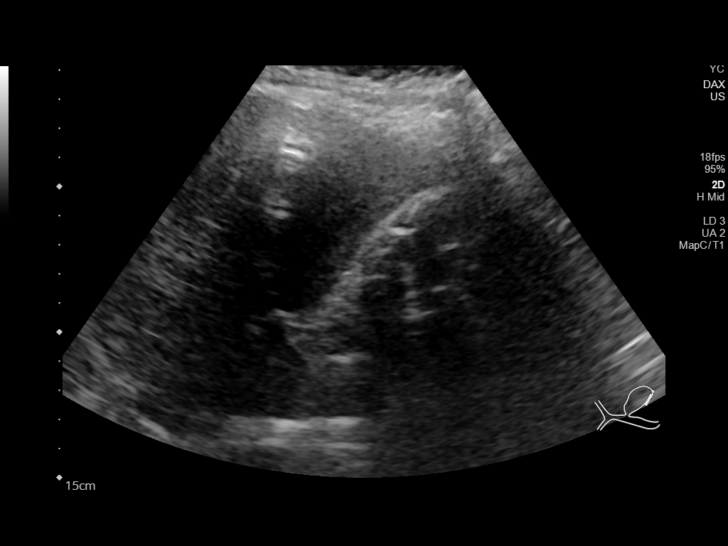
[im 15/44]
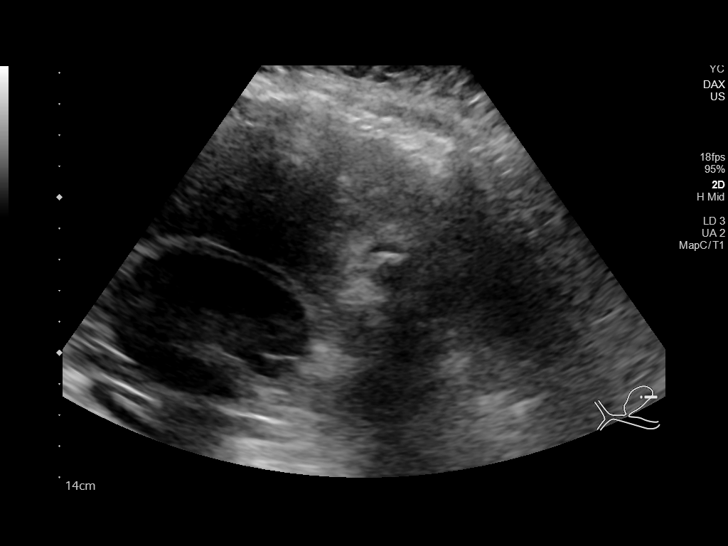
[im 17/44]
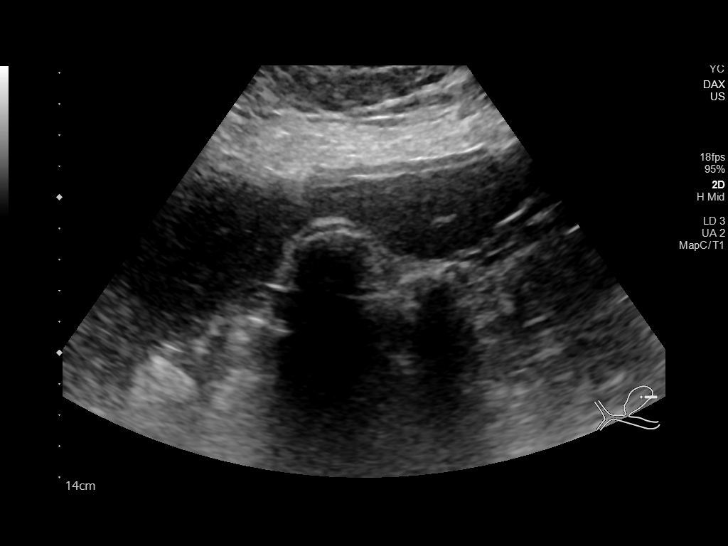
[im 20/44]
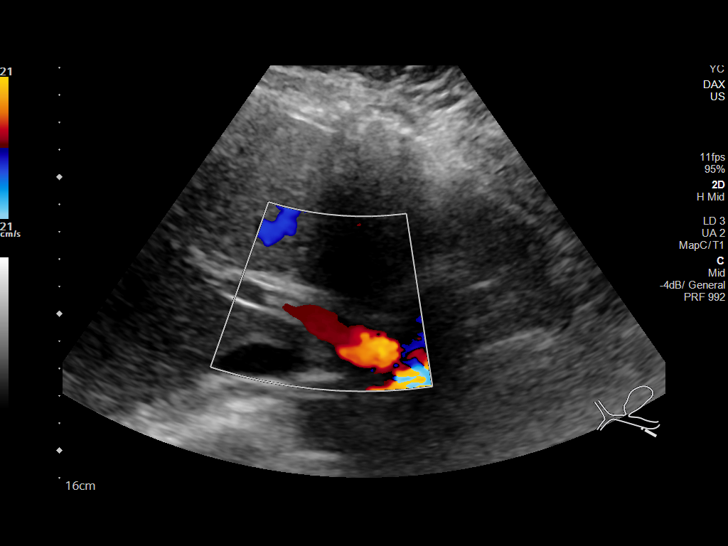
[im 24/44]
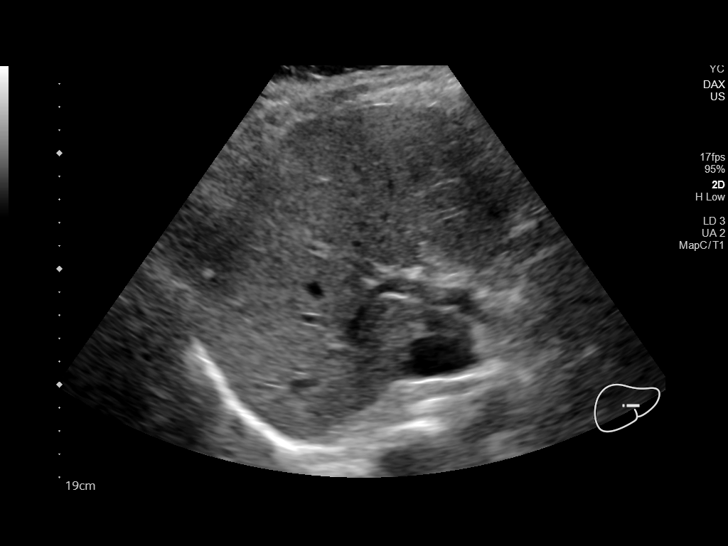
[im 27/44]
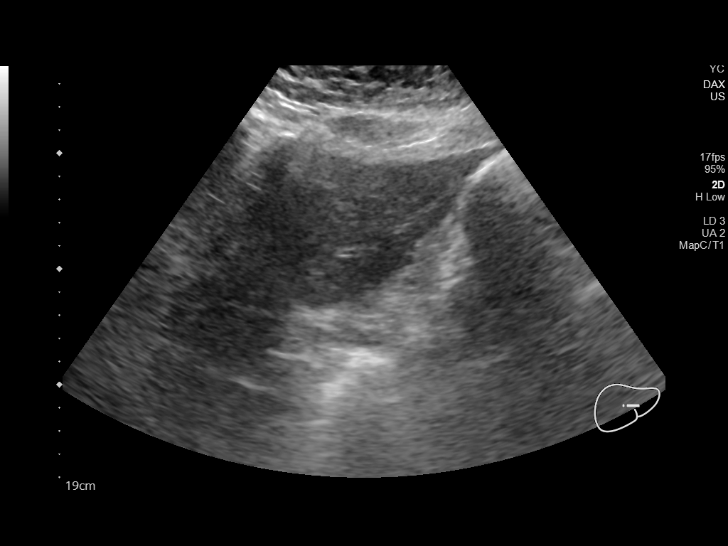
[im 29/44]
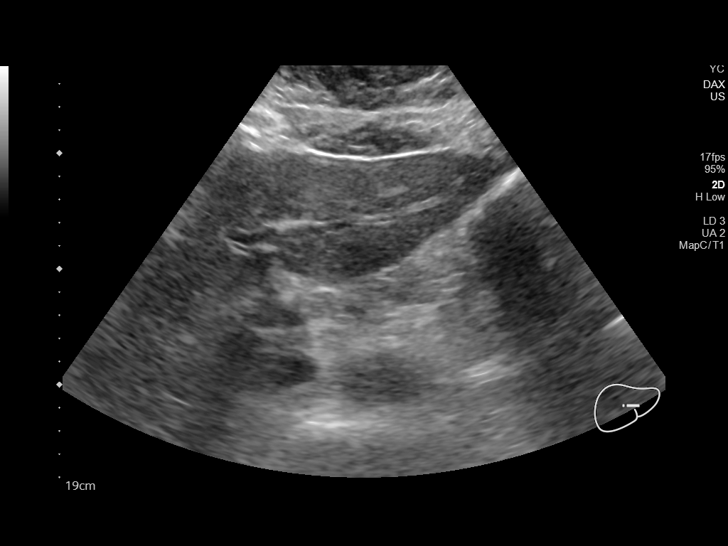
[im 33/44]
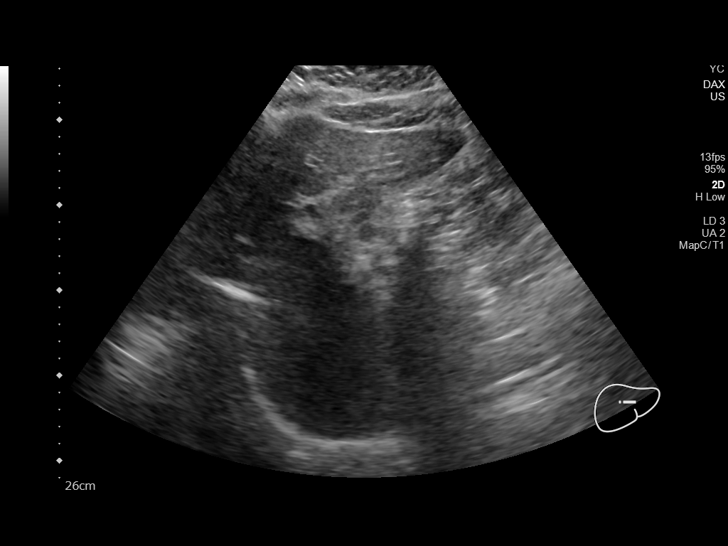
[im 36/44]
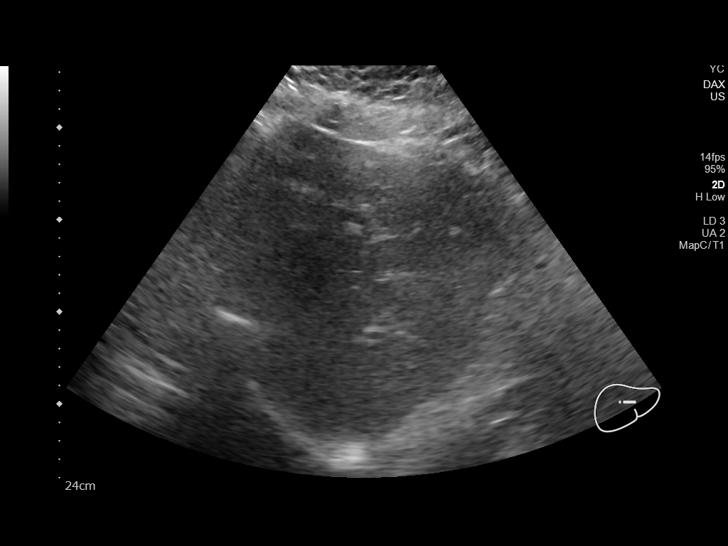
[im 40/44]
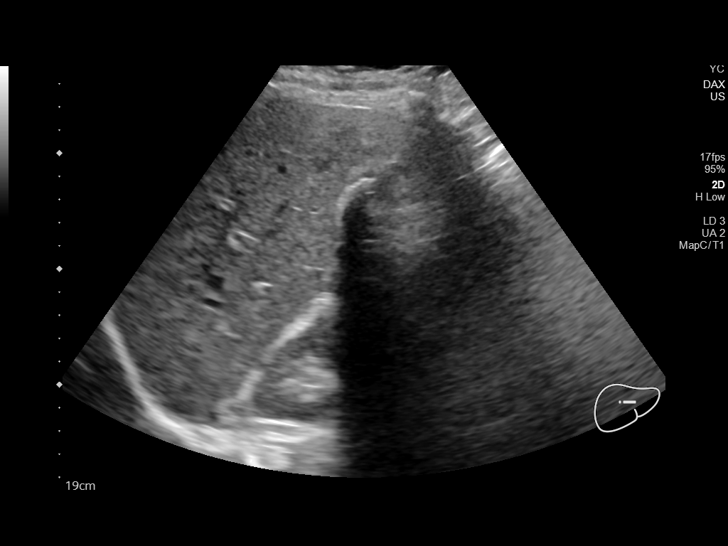
[im 44/44]
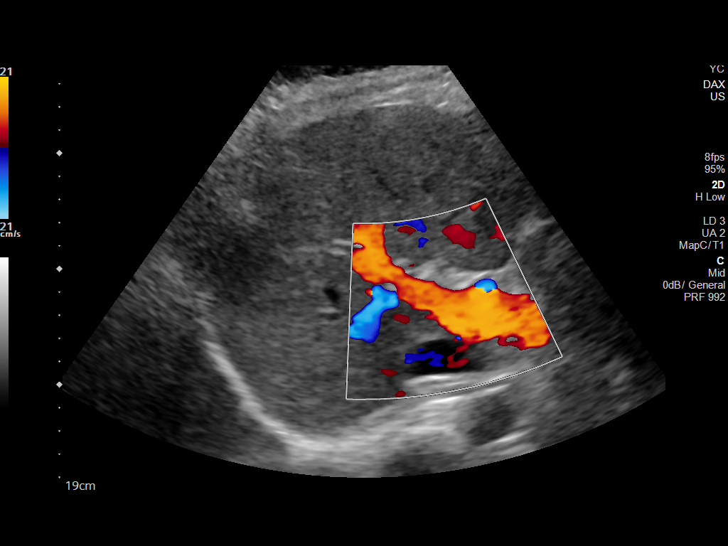

[14 of 25 positions shown; findings below may reference images not displayed]

FINDINGS: Gallbladder:

Nonfasting exam with patient reportedly eating approximately 1 hour
prior to the study. The gallbladder appears largely contracted
around numerous echogenic, posteriorly shadowing gallstones. Large
gallstone measuring up to 8 mm in size. No gallbladder wall
thickening or pericholecystic inflammation. Sonographic Murphy sign
is reportedly negative.

Common bile duct:

Diameter: 2 mm, nondilated

Liver:

No focal lesion identified. Within normal limits in parenchymal
echogenicity. Portal vein is patent on color Doppler imaging with
normal direction of blood flow towards the liver.

Other: None.
IMPRESSION: Nonfasting examination.

Cholelithiasis without evidence of acute cholecystitis.

## 2021-03-04 ENCOUNTER — Telehealth: Payer: Self-pay

## 2021-03-04 NOTE — Telephone Encounter (Signed)
I am accepting new patients currently so there is not a need to ask.

## 2021-03-04 NOTE — Telephone Encounter (Signed)
I don't really see a question here?

## 2021-08-08 ENCOUNTER — Ambulatory Visit: Payer: Self-pay | Admitting: Internal Medicine

## 2021-08-11 ENCOUNTER — Other Ambulatory Visit: Payer: Self-pay

## 2021-08-11 ENCOUNTER — Ambulatory Visit (HOSPITAL_COMMUNITY)
Admission: EM | Admit: 2021-08-11 | Discharge: 2021-08-11 | Disposition: A | Discharge status: Home / Self Care | Payer: BC Managed Care – PPO

## 2021-08-11 ENCOUNTER — Encounter (HOSPITAL_COMMUNITY): Payer: Self-pay | Admitting: *Deleted

## 2021-08-11 NOTE — ED Triage Notes (Signed)
Pain at bellybutton that started after drinking coffee. Pt reports positive pregnancy test  x2. Pt waiting for HCG results from MD,OB.

## 2021-09-16 LAB — OB RESULTS CONSOLE ABO/RH: RH Type: POSITIVE

## 2021-09-16 LAB — HEPATITIS C ANTIBODY: HCV Ab: NEGATIVE

## 2021-09-16 LAB — OB RESULTS CONSOLE GC/CHLAMYDIA
Chlamydia: NEGATIVE
Neisseria Gonorrhea: NEGATIVE

## 2021-09-16 LAB — OB RESULTS CONSOLE ANTIBODY SCREEN: Antibody Screen: NEGATIVE

## 2021-09-16 LAB — OB RESULTS CONSOLE RUBELLA ANTIBODY, IGM: Rubella: IMMUNE

## 2021-09-16 LAB — OB RESULTS CONSOLE HEPATITIS B SURFACE ANTIGEN: Hepatitis B Surface Ag: NEGATIVE

## 2021-09-16 LAB — OB RESULTS CONSOLE RPR: RPR: NONREACTIVE

## 2021-09-16 LAB — OB RESULTS CONSOLE HIV ANTIBODY (ROUTINE TESTING): HIV: NONREACTIVE

## 2021-10-19 ENCOUNTER — Inpatient Hospital Stay (HOSPITAL_COMMUNITY)
Admission: AD | Admit: 2021-10-19 | Discharge: 2021-10-19 | Disposition: A | Discharge status: Home / Self Care | Payer: BC Managed Care – PPO | Attending: Obstetrics and Gynecology | Admitting: Obstetrics and Gynecology

## 2021-10-19 ENCOUNTER — Encounter (HOSPITAL_COMMUNITY): Payer: Self-pay

## 2021-10-19 ENCOUNTER — Inpatient Hospital Stay (HOSPITAL_COMMUNITY): Payer: BC Managed Care – PPO

## 2021-10-19 DIAGNOSIS — O99891 Other specified diseases and conditions complicating pregnancy: Secondary | ICD-10-CM | POA: Diagnosis not present

## 2021-10-19 DIAGNOSIS — R7303 Prediabetes: Secondary | ICD-10-CM | POA: Diagnosis not present

## 2021-10-19 DIAGNOSIS — Z3A14 14 weeks gestation of pregnancy: Secondary | ICD-10-CM | POA: Diagnosis not present

## 2021-10-19 DIAGNOSIS — O09522 Supervision of elderly multigravida, second trimester: Secondary | ICD-10-CM | POA: Diagnosis not present

## 2021-10-19 DIAGNOSIS — O209 Hemorrhage in early pregnancy, unspecified: Secondary | ICD-10-CM | POA: Diagnosis present

## 2021-10-19 DIAGNOSIS — N930 Postcoital and contact bleeding: Secondary | ICD-10-CM

## 2021-10-19 DIAGNOSIS — O4692 Antepartum hemorrhage, unspecified, second trimester: Secondary | ICD-10-CM | POA: Diagnosis not present

## 2021-10-19 LAB — WET PREP, GENITAL
Clue Cells Wet Prep HPF POC: NONE SEEN
Sperm: NONE SEEN
Trich, Wet Prep: NONE SEEN
WBC, Wet Prep HPF POC: <10 (ref <10)
Yeast Wet Prep HPF POC: NONE SEEN

## 2021-10-19 LAB — OB RESULTS CONSOLE ABO/RH: RH Type: POSITIVE

## 2021-10-19 NOTE — MAU Note (Signed)
.  Hailey Harris is a 41 y.o. at 14.5 weeks here in MAU reporting: after using bathroom pt reports gush of red blood and placed a pad on pt states that it is currently similar to her menstrual cycle.  This started at 1800 ?Pain score:0 Pt denies cramping or discomfort. ?Vitals:  ? 10/19/21 1917  ?BP: 138/88  ?Pulse: 98  ?Resp: 18  ?Temp: 98.5 ?F (36.9 ?C)  ?SpO2: 99%  ?   ?FHT: 145 ? ? ?

## 2021-10-19 NOTE — MAU Provider Note (Addendum)
?History  ?  ? ?CSN: [id number] ? ?Arrival date and time: 10/19/21 1902 ? ? None  ?  ? ?Chief Complaint  ?Patient presents with  ? Vaginal Bleeding  ? ?HPI ?Hailey Harris is a 41 y.o. G2P0010 at 87w5dwho presents to MAU for vaginal bleeding. Patient reports vaginal bleeding started around 6pm this evening. Reports she went to the bathroom and had only a small amount of spotting, but then felt a gush of blood, which continued to trickle like a period. The bleeding has since slowed down. She denies pain, itching/odor, abnormal discharge, or urinary s/s. She had intercourse yesterday. ? ?[facility]  ? ? ?OB History   ? ? Gravida  ?2  ? Para  ?0  ? Term  ?0  ? Preterm  ?0  ? AB  ?1  ? Living  ?   ?  ? ? SAB  ?1  ? IAB  ?0  ? Ectopic  ?0  ? Multiple  ?   ? Live Births  ?   ?   ?  ?  ? ? ?Past Medical History:  ?Diagnosis Date  ? Amenorrhea, secondary   ? Hirsutism   ? Hypertension   ? Infertility, female   ? LGSIL on Pap smear of cervix 01/2005  ? Patient denies medical problems   ? PCOS (polycystic ovarian syndrome)   ? Pre-diabetes   ? ? ?Past Surgical History:  ?Procedure Laterality Date  ? NO PAST SURGERIES    ? ? ?Family History  ?Problem Relation Age of Onset  ? Breast cancer Mother 516 ?     Triple Negative  ? Heart disease Father   ? Kidney cancer Maternal Grandmother   ? Diabetes Maternal Grandmother   ? Hypertension Paternal Grandmother   ? ? ?Social History  ? ?Tobacco Use  ? Smoking status: Never  ? Smokeless tobacco: Never  ?Vaping Use  ? Vaping Use: Never used  ?Substance Use Topics  ? Alcohol use: Yes  ?  Alcohol/week: 0.0 standard drinks  ?  Comment: occasional  ? Drug use: No  ? ? ?Allergies: No Known Allergies ? ?Medications Prior to Admission  ?Medication Sig Dispense Refill Last Dose  ? HYDROcodone-acetaminophen (NORCO/VICODIN) 5-325 MG tablet Take 1 tablet by mouth every 4 (four) hours as needed. 10 tablet 0   ? metFORMIN (GLUCOPHAGE) 500 MG tablet Take 500 mg by  mouth 3 (three) times daily.     ? ondansetron (ZOFRAN ODT) 4 MG disintegrating tablet Take 1 tablet (4 mg total) by mouth every 8 (eight) hours as needed for nausea. 10 tablet 0   ? ? ?Review of Systems  ?Constitutional: Negative.   ?Respiratory: Negative.    ?Cardiovascular: Negative.   ?Gastrointestinal: Negative.   ?Genitourinary:  Positive for vaginal bleeding. Negative for dysuria.  ?Musculoskeletal: Negative.   ?Neurological: Negative.   ?Physical Exam  ? ?Blood pressure 138/88, pulse 98, temperature 98.5 ?F (36.9 ?C), temperature source Oral, resp. rate 18, height '5\' 8"'$  (1.727 m), weight (!) 138.4 kg, last menstrual period 07/08/2021, SpO2 99 %. ? ?Physical Exam ?Vitals and nursing note reviewed. Exam conducted with a chaperone present.  ?Constitutional:   ?   General: She is not in acute distress. ?   Appearance: She is obese.  ?Eyes:  ?   Extraocular Movements: Extraocular movements intact.  ?   Pupils: Pupils are equal, round, and reactive to light.  ?Cardiovascular:  ?   Rate and  Rhythm: Normal rate.  ?Pulmonary:  ?   Effort: Pulmonary effort is normal.  ?Abdominal:  ?   Palpations: Abdomen is soft.  ?   Tenderness: There is no abdominal tenderness.  ?Genitourinary: ?   Comments: NEFG, vaginal walls pink with rugae, small dark clot removed with fox swab, no active bleeding, cervix visually closed without lesions/masses ?VE: closed/thick ?Musculoskeletal:     ?   General: Normal range of motion.  ?   Cervical back: Normal range of motion.  ?Skin: ?   General: Skin is warm and dry.  ?Neurological:  ?   General: No focal deficit present.  ?   Mental Status: She is alert and oriented to person, place, and time.  ?Psychiatric:     ?   Mood and Affect: Mood normal.     ?   Behavior: Behavior normal.     ?   Thought Content: Thought content normal.     ?   Judgment: Judgment normal.  ? ?FHR: 145 bpm via doppler ? ?MAU Course  ?Procedures ?Speculum exam, wet prep, GC/CT ?[place] ? ?MDM ?Bleeding minimal on spec  exam ?Blood type O positive, Rhogam not indicated ?Wet prep negative, GC/CT pending  ?Report given to E. Purcell Nails, NP at 2100 ? ? ?Renee Harder, CNM ?10/19/21 ?9:08 PM ? ? ?Ultrasound results reviewed. Posterior placenta. No evidence of previa or abruption. Normal fluid volume. Normal cervical length. ?Assessment and Plan  ? ?1. Vaginal bleeding in pregnancy, second trimester   ?2. PCB (post coital bleeding)   ?3. [redacted] weeks gestation of pregnancy   ? ?-pelvic rest ?-reviewed bleeding precautions & reasons to return to MAU ?-Keep f/u with OB ? ?Jorje Guild, NP  ? ?

## 2021-10-21 LAB — GC/CHLAMYDIA PROBE AMP (~~LOC~~) NOT AT ARMC
Chlamydia: NEGATIVE
Comment: NEGATIVE
Comment: NORMAL
Neisseria Gonorrhea: NEGATIVE

## 2022-01-22 ENCOUNTER — Ambulatory Visit: Payer: BC Managed Care – PPO | Admitting: Internal Medicine

## 2022-01-31 ENCOUNTER — Ambulatory Visit: Payer: BC Managed Care – PPO | Admitting: Internal Medicine

## 2022-01-31 ENCOUNTER — Encounter: Payer: Self-pay | Admitting: Internal Medicine

## 2022-01-31 VITALS — BP 126/70 | HR 100 | Resp 18 | Ht 68.0 in | Wt 299.8 lb

## 2022-01-31 DIAGNOSIS — Z0000 Encounter for general adult medical examination without abnormal findings: Secondary | ICD-10-CM

## 2022-01-31 NOTE — Patient Instructions (Signed)
At some point after the pregnancy we can check the cholesterol or can do this next year when you come.

## 2022-01-31 NOTE — Progress Notes (Signed)
   Subjective:   Patient ID: Hailey Harris, female    DOB: October 22, 1980, 41 y.o.   MRN: [id number]  HPI The patient is a new 41 YO female coming in for physical.  PMH, Vesper, social history reviewed and updated  Review of Systems  Constitutional: Negative.   HENT: Negative.    Eyes: Negative.   Respiratory:  Negative for cough, chest tightness and shortness of breath.   Cardiovascular:  Negative for chest pain, palpitations and leg swelling.  Gastrointestinal:  Negative for abdominal distention, abdominal pain, constipation, diarrhea, nausea and vomiting.  Musculoskeletal: Negative.   Skin: Negative.   Neurological: Negative.   Psychiatric/Behavioral: Negative.      Objective:  Physical Exam Constitutional:      Appearance: She is well-developed.  HENT:     Head: Normocephalic and atraumatic.  Cardiovascular:     Rate and Rhythm: Normal rate and regular rhythm.  Pulmonary:     Effort: Pulmonary effort is normal. No respiratory distress.     Breath sounds: Normal breath sounds. No wheezing or rales.  Abdominal:     General: Bowel sounds are normal. There is distension.     Palpations: Abdomen is soft.     Tenderness: There is no abdominal tenderness. There is no rebound.     Comments: pregnant  Musculoskeletal:     Cervical back: Normal range of motion.  Skin:    General: Skin is warm and dry.  Neurological:     Mental Status: She is alert and oriented to person, place, and time.     Coordination: Coordination normal.     Vitals:   01/31/22 1458  BP: 126/70  Pulse: 100  Resp: 18  SpO2: 98%  Weight: 299 lb 12.8 oz (136 kg)  Height: '5\' 8"'$  (1.727 m)    Assessment & Plan:

## 2022-01-31 NOTE — Assessment & Plan Note (Signed)
Flu shot yearly. Covid-19 counseled. Tetanus up to date. Mammogram due once pregnancy over, pap smear up to date. Counseled about sun safety and mole surveillance. Counseled about the dangers of distracted driving. Given 10 year screening recommendations.

## 2022-02-21 ENCOUNTER — Other Ambulatory Visit: Payer: Self-pay | Admitting: Obstetrics

## 2022-02-21 DIAGNOSIS — O99013 Anemia complicating pregnancy, third trimester: Secondary | ICD-10-CM

## 2022-02-24 ENCOUNTER — Other Ambulatory Visit: Payer: Self-pay | Admitting: Obstetrics & Gynecology

## 2022-02-24 DIAGNOSIS — O99013 Anemia complicating pregnancy, third trimester: Secondary | ICD-10-CM

## 2022-02-27 ENCOUNTER — Non-Acute Institutional Stay (HOSPITAL_COMMUNITY)
Admission: RE | Admit: 2022-02-27 | Discharge: 2022-02-27 | Disposition: A | Discharge status: Home / Self Care | Payer: BC Managed Care – PPO | Source: Ambulatory Visit | Attending: Internal Medicine | Admitting: Internal Medicine

## 2022-02-27 DIAGNOSIS — O99013 Anemia complicating pregnancy, third trimester: Secondary | ICD-10-CM | POA: Insufficient documentation

## 2022-02-27 DIAGNOSIS — D649 Anemia, unspecified: Secondary | ICD-10-CM | POA: Diagnosis not present

## 2022-02-27 MED ORDER — SODIUM CHLORIDE 0.9 % IV SOLN: INTRAVENOUS | Status: DC | PRN

## 2022-02-27 MED ORDER — SODIUM CHLORIDE 0.9 % IV SOLN
500.0000 mg | INTRAVENOUS | Status: DC
  Administered 2022-02-27: 500 mg via INTRAVENOUS
  Filled 2022-02-27: qty 25

## 2022-02-27 MED ORDER — ACETAMINOPHEN 500 MG PO TABS
500.0000 mg | ORAL_TABLET | ORAL | Status: DC
  Administered 2022-02-27: 500 mg via ORAL
  Filled 2022-02-27: qty 1

## 2022-02-27 MED ORDER — DIPHENHYDRAMINE HCL 25 MG PO CAPS
25.0000 mg | ORAL_CAPSULE | ORAL | Status: DC
  Filled 2022-02-27: qty 1

## 2022-02-27 NOTE — Progress Notes (Signed)
PATIENT CARE CENTER NOTE   Diagnosis: Anemia complicating pregnancy in third trimester [O99.013]   Provider: Azucena Fallen, MD   Procedure: Venofer infusion   Note:  Patient received Venofer 500 mg infusion (dose 1 of 2) via PIV. Patient pre-medications with Tylenol. Patient had taken Benadryl prior to arrival so Benadryl not given. Patient tolerated infusion well with no adverse reaction. Vital signs stable. Discharge instructions given. Patient to come back in 2 weeks for second infusion. Alert, oriented and ambulatory at discharge.

## 2022-03-11 LAB — OB RESULTS CONSOLE GBS: GBS: NEGATIVE

## 2022-03-13 ENCOUNTER — Encounter (HOSPITAL_COMMUNITY): Payer: BC Managed Care – PPO

## 2022-03-19 ENCOUNTER — Non-Acute Institutional Stay (HOSPITAL_COMMUNITY)
Admission: RE | Admit: 2022-03-19 | Discharge: 2022-03-19 | Disposition: A | Discharge status: Home / Self Care | Payer: BC Managed Care – PPO | Source: Ambulatory Visit | Attending: Internal Medicine | Admitting: Internal Medicine

## 2022-03-19 DIAGNOSIS — O99013 Anemia complicating pregnancy, third trimester: Secondary | ICD-10-CM | POA: Insufficient documentation

## 2022-03-19 MED ORDER — SODIUM CHLORIDE 0.9 % IV SOLN
500.0000 mg | Freq: Once | INTRAVENOUS | Status: AC
  Administered 2022-03-19: 500 mg via INTRAVENOUS
  Filled 2022-03-19: qty 25

## 2022-03-19 MED ORDER — SODIUM CHLORIDE 0.9 % IV SOLN: INTRAVENOUS | Status: DC | PRN

## 2022-03-19 MED ORDER — DIPHENHYDRAMINE HCL 25 MG PO CAPS
25.0000 mg | ORAL_CAPSULE | Freq: Once | ORAL | Status: AC
  Administered 2022-03-19: 25 mg via ORAL
  Filled 2022-03-19: qty 1

## 2022-03-19 MED ORDER — ACETAMINOPHEN 500 MG PO TABS
500.0000 mg | ORAL_TABLET | Freq: Once | ORAL | Status: AC
  Administered 2022-03-19: 500 mg via ORAL
  Filled 2022-03-19: qty 1

## 2022-03-19 NOTE — Progress Notes (Signed)
PATIENT CARE CENTER NOTE:  Diagnosis: anemia complicating pregnancy in third trimester ( O99. 013)  Provider: Azucena Fallen MD  Procedure: Venofer '500mg'$  infusion   Patient received IV Venofer ( dose #2 of 2). Pt premedicated with tylenol and benadryl PO per orders. Tolerated well with no adverse reaction, vitals stable.  Pt declined AVS.Patient alert, oriented, and ambulatory at the time of discharge.

## 2022-03-27 ENCOUNTER — Telehealth (HOSPITAL_COMMUNITY): Payer: Self-pay | Admitting: *Deleted

## 2022-03-27 ENCOUNTER — Encounter (HOSPITAL_COMMUNITY): Payer: Self-pay

## 2022-03-27 NOTE — Telephone Encounter (Signed)
Preadmission screen  

## 2022-03-28 ENCOUNTER — Encounter (HOSPITAL_COMMUNITY): Payer: Self-pay | Admitting: *Deleted

## 2022-03-28 ENCOUNTER — Telehealth (HOSPITAL_COMMUNITY): Payer: Self-pay | Admitting: *Deleted

## 2022-03-28 NOTE — Telephone Encounter (Signed)
Preadmission screen  

## 2022-04-01 ENCOUNTER — Other Ambulatory Visit: Payer: Self-pay | Admitting: Obstetrics

## 2022-04-01 NOTE — Progress Notes (Signed)
Hailey Harris is a 41 y.o. G2P0010 at 68w3dpresenting for IOL due to chronic htn and AMA. Pt notes rare contractions . Good fetal movement, No vaginal bleeding, not leaking fluid.  [facility] - Dated by LMP c/w 8 wk u/s - obesity - h/o PCOS, nl DS and not on metformin - AMA, nl Panorama - Anemia with low ferritin of 10 despite oral iron, s/p IV iron x 2 - chronic htn, suspected at 10 wks, diagnosed at 14 wks, pt started on baby ASA and '100mg'$  po labetalol tid, bp well controlled on this dose throughout preg   Prenatal Transfer Tool  Maternal Diabetes: No Genetic Screening: Normal Maternal Ultrasounds/Referrals: Normal Fetal Ultrasounds or other Referrals:  None Maternal Substance Abuse:  No Significant Maternal Medications:  None Significant Maternal Lab Results: Group B Strep negative     OB History     Gravida  2   Para  0   Term  0   Preterm  0   AB  1   Living         SAB  1   IAB  0   Ectopic  0   Multiple      Live Births             Past Medical History:  Diagnosis Date   Amenorrhea, secondary    Hirsutism    Hypertension    Infertility, female    LGSIL on Pap smear of cervix 01/2005   Patient denies medical problems    PCOS (polycystic ovarian syndrome)    Pre-diabetes    Past Surgical History:  Procedure Laterality Date   NO PAST SURGERIES     Family History: family history includes Breast cancer (age of onset: 539 in her mother; Diabetes in her maternal grandmother; Heart disease in her father; Hypertension in her paternal grandmother; Kidney cancer in her maternal grandmother. Social History:  reports that she has never smoked. She has never used smokeless tobacco. She reports current alcohol use. She reports that she does not use drugs.  Review of Systems - Negative except discomfort of pregnancy     Last menstrual period 07/08/2021.  Physical Exam:  Gen: well appearing, no distress CV: RRR Pulm:  CTAB Back: no CVAT Abd: gravid, NT, no RUQ pain LE: trace edema, equal bilaterally, non-tender Toco: *** FH: baseline ***, accelerations present, no deceleratons, 10 beat variability  Prenatal labs: ABO, Rh: O/Positive/-- (03/18 0000) Antibody: Negative (02/13 0000) Rubella: Immune (02/13 0000) RPR: Nonreactive (02/13 0000)  HBsAg: Negative (02/13 0000)  HIV: Non-reactive (02/13 0000)  GBS: Negative/-- (08/08 0000)  1 hr Glucola 125  Genetic screening normal Panorama Anatomy [place]ormal   Assessment/Plan: 41y.o. G2P0010 at 310w3d Chronic htn, cont labetalol, check labs on admit, move to delivery - AMA - IOL. Plan cytotec x 2 overnight, foley/ pit in AM GBS neg - anemia, watch CBC.    KeAla Dach/29/2023, 9:37 PM

## 2022-04-02 ENCOUNTER — Inpatient Hospital Stay (HOSPITAL_COMMUNITY): Payer: BC Managed Care – PPO | Admitting: Anesthesiology

## 2022-04-02 ENCOUNTER — Other Ambulatory Visit: Payer: Self-pay

## 2022-04-02 ENCOUNTER — Inpatient Hospital Stay (HOSPITAL_BASED_OUTPATIENT_CLINIC_OR_DEPARTMENT_OTHER): Payer: BC Managed Care – PPO

## 2022-04-02 ENCOUNTER — Encounter (HOSPITAL_COMMUNITY)
Admission: AD | Disposition: A | Discharge status: Home / Self Care | Payer: Self-pay | Source: Home / Self Care | Attending: Obstetrics

## 2022-04-02 ENCOUNTER — Encounter (HOSPITAL_COMMUNITY): Payer: Self-pay | Admitting: Obstetrics and Gynecology

## 2022-04-02 ENCOUNTER — Inpatient Hospital Stay (HOSPITAL_COMMUNITY)
Admission: AD | Admit: 2022-04-02 | Discharge: 2022-04-04 | DRG: 786 | Disposition: A | Discharge status: Home / Self Care | Payer: BC Managed Care – PPO | Attending: Obstetrics | Admitting: Obstetrics

## 2022-04-02 DIAGNOSIS — O09523 Supervision of elderly multigravida, third trimester: Secondary | ICD-10-CM | POA: Diagnosis not present

## 2022-04-02 DIAGNOSIS — Z3A38 38 weeks gestation of pregnancy: Secondary | ICD-10-CM

## 2022-04-02 DIAGNOSIS — O99284 Endocrine, nutritional and metabolic diseases complicating childbirth: Secondary | ICD-10-CM | POA: Diagnosis present

## 2022-04-02 DIAGNOSIS — E282 Polycystic ovarian syndrome: Secondary | ICD-10-CM | POA: Diagnosis present

## 2022-04-02 DIAGNOSIS — D259 Leiomyoma of uterus, unspecified: Secondary | ICD-10-CM

## 2022-04-02 DIAGNOSIS — Z3490 Encounter for supervision of normal pregnancy, unspecified, unspecified trimester: Secondary | ICD-10-CM | POA: Diagnosis present

## 2022-04-02 DIAGNOSIS — O99214 Obesity complicating childbirth: Secondary | ICD-10-CM | POA: Diagnosis present

## 2022-04-02 DIAGNOSIS — O411230 Chorioamnionitis, third trimester, not applicable or unspecified: Secondary | ICD-10-CM | POA: Diagnosis present

## 2022-04-02 DIAGNOSIS — O411290 Chorioamnionitis, unspecified trimester, not applicable or unspecified: Secondary | ICD-10-CM | POA: Clinically undetermined

## 2022-04-02 DIAGNOSIS — O368390 Maternal care for abnormalities of the fetal heart rate or rhythm, unspecified trimester, not applicable or unspecified: Secondary | ICD-10-CM | POA: Diagnosis not present

## 2022-04-02 DIAGNOSIS — O368930 Maternal care for other specified fetal problems, third trimester, not applicable or unspecified: Secondary | ICD-10-CM | POA: Diagnosis not present

## 2022-04-02 DIAGNOSIS — O10919 Unspecified pre-existing hypertension complicating pregnancy, unspecified trimester: Secondary | ICD-10-CM | POA: Diagnosis present

## 2022-04-02 DIAGNOSIS — I10 Essential (primary) hypertension: Secondary | ICD-10-CM | POA: Diagnosis present

## 2022-04-02 DIAGNOSIS — O9902 Anemia complicating childbirth: Secondary | ICD-10-CM | POA: Diagnosis present

## 2022-04-02 DIAGNOSIS — O1002 Pre-existing essential hypertension complicating childbirth: Secondary | ICD-10-CM | POA: Diagnosis present

## 2022-04-02 DIAGNOSIS — O3413 Maternal care for benign tumor of corpus uteri, third trimester: Secondary | ICD-10-CM

## 2022-04-02 DIAGNOSIS — O479 False labor, unspecified: Secondary | ICD-10-CM

## 2022-04-02 LAB — CBC
HCT: 39.2 % (ref 36.0–46.0)
Hemoglobin: 13.2 g/dL (ref 12.0–15.0)
MCH: 29.5 pg (ref 26.0–34.0)
MCHC: 33.7 g/dL (ref 30.0–36.0)
MCV: 87.5 fL (ref 80.0–100.0)
Platelets: 238 10*3/uL (ref 150–400)
RBC: 4.48 MIL/uL (ref 3.87–5.11)
RDW: 18.7 % — ABNORMAL HIGH (ref 11.5–15.5)
WBC: 16.9 10*3/uL — ABNORMAL HIGH (ref 4.0–10.5)
nRBC: 0 % (ref 0.0–0.2)

## 2022-04-02 LAB — TYPE AND SCREEN
ABO/RH(D): O POS
Antibody Screen: NEGATIVE

## 2022-04-02 LAB — RPR: RPR Ser Ql: NONREACTIVE

## 2022-04-02 SURGERY — Surgical Case
Anesthesia: Epidural

## 2022-04-02 MED ORDER — DIPHENHYDRAMINE HCL 25 MG PO CAPS: 25.0000 mg | ORAL_CAPSULE | ORAL | Status: DC | PRN

## 2022-04-02 MED ORDER — DEXAMETHASONE SODIUM PHOSPHATE 10 MG/ML IJ SOLN
INTRAMUSCULAR | Status: AC
  Filled 2022-04-02: qty 1

## 2022-04-02 MED ORDER — COCONUT OIL OIL: 1.0000 | TOPICAL_OIL | Status: DC | PRN

## 2022-04-02 MED ORDER — KETOROLAC TROMETHAMINE 30 MG/ML IJ SOLN
30.0000 mg | Freq: Once | INTRAMUSCULAR | Status: AC | PRN
  Administered 2022-04-02: 30 mg via INTRAVENOUS

## 2022-04-02 MED ORDER — KETOROLAC TROMETHAMINE 30 MG/ML IJ SOLN
INTRAMUSCULAR | Status: AC
  Filled 2022-04-02: qty 1

## 2022-04-02 MED ORDER — DEXAMETHASONE SODIUM PHOSPHATE 10 MG/ML IJ SOLN
INTRAMUSCULAR | Status: DC | PRN
  Administered 2022-04-02: 4 mg via INTRAVENOUS

## 2022-04-02 MED ORDER — SOD CITRATE-CITRIC ACID 500-334 MG/5ML PO SOLN
30.0000 mL | ORAL | Status: DC | PRN
  Administered 2022-04-02: 30 mL via ORAL
  Filled 2022-04-02: qty 30

## 2022-04-02 MED ORDER — STERILE WATER FOR IRRIGATION IR SOLN
Status: DC | PRN
  Administered 2022-04-02: 1000 mL

## 2022-04-02 MED ORDER — PHENYLEPHRINE 80 MCG/ML (10ML) SYRINGE FOR IV PUSH (FOR BLOOD PRESSURE SUPPORT): 80.0000 ug | PREFILLED_SYRINGE | INTRAVENOUS | Status: DC | PRN

## 2022-04-02 MED ORDER — SCOPOLAMINE 1 MG/3DAYS TD PT72
1.0000 | MEDICATED_PATCH | Freq: Once | TRANSDERMAL | Status: DC
  Administered 2022-04-02: 1.5 mg via TRANSDERMAL

## 2022-04-02 MED ORDER — TERBUTALINE SULFATE 1 MG/ML IJ SOLN: 0.2500 mg | Freq: Once | INTRAMUSCULAR | Status: AC

## 2022-04-02 MED ORDER — TETANUS-DIPHTH-ACELL PERTUSSIS 5-2.5-18.5 LF-MCG/0.5 IM SUSY: 0.5000 mL | PREFILLED_SYRINGE | Freq: Once | INTRAMUSCULAR | Status: DC

## 2022-04-02 MED ORDER — NALOXONE HCL 0.4 MG/ML IJ SOLN: 0.4000 mg | INTRAMUSCULAR | Status: DC | PRN

## 2022-04-02 MED ORDER — DIPHENHYDRAMINE HCL 50 MG/ML IJ SOLN: 12.5000 mg | INTRAMUSCULAR | Status: DC | PRN

## 2022-04-02 MED ORDER — OXYTOCIN-SODIUM CHLORIDE 30-0.9 UT/500ML-% IV SOLN
INTRAVENOUS | Status: AC
  Filled 2022-04-02: qty 500

## 2022-04-02 MED ORDER — PHENYLEPHRINE 80 MCG/ML (10ML) SYRINGE FOR IV PUSH (FOR BLOOD PRESSURE SUPPORT)
PREFILLED_SYRINGE | INTRAVENOUS | Status: DC | PRN
  Administered 2022-04-02 (×2): 80 ug via INTRAVENOUS

## 2022-04-02 MED ORDER — ONDANSETRON HCL 4 MG/2ML IJ SOLN
INTRAMUSCULAR | Status: DC | PRN
  Administered 2022-04-02: 4 mg via INTRAVENOUS

## 2022-04-02 MED ORDER — MORPHINE SULFATE (PF) 0.5 MG/ML IJ SOLN
INTRAMUSCULAR | Status: DC | PRN
  Administered 2022-04-02: 3 mg via EPIDURAL

## 2022-04-02 MED ORDER — DIBUCAINE (PERIANAL) 1 % EX OINT: 1.0000 | TOPICAL_OINTMENT | CUTANEOUS | Status: DC | PRN

## 2022-04-02 MED ORDER — TERBUTALINE SULFATE 1 MG/ML IJ SOLN
INTRAMUSCULAR | Status: AC
  Filled 2022-04-02: qty 1

## 2022-04-02 MED ORDER — LIDOCAINE-EPINEPHRINE (PF) 2 %-1:200000 IJ SOLN
INTRAMUSCULAR | Status: DC | PRN
  Administered 2022-04-02: 5 mL via EPIDURAL

## 2022-04-02 MED ORDER — MORPHINE SULFATE (PF) 0.5 MG/ML IJ SOLN
INTRAMUSCULAR | Status: AC
  Filled 2022-04-02: qty 10

## 2022-04-02 MED ORDER — SIMETHICONE 80 MG PO CHEW: 80.0000 mg | CHEWABLE_TABLET | ORAL | Status: DC | PRN

## 2022-04-02 MED ORDER — KETOROLAC TROMETHAMINE 30 MG/ML IJ SOLN
30.0000 mg | Freq: Four times a day (QID) | INTRAMUSCULAR | Status: AC
  Administered 2022-04-02 – 2022-04-03 (×3): 30 mg via INTRAVENOUS
  Filled 2022-04-02 (×3): qty 1

## 2022-04-02 MED ORDER — SCOPOLAMINE 1 MG/3DAYS TD PT72
MEDICATED_PATCH | TRANSDERMAL | Status: AC
  Filled 2022-04-02: qty 1

## 2022-04-02 MED ORDER — TERBUTALINE SULFATE 1 MG/ML IJ SOLN
0.2500 mg | Freq: Once | INTRAMUSCULAR | Status: AC | PRN
  Administered 2022-04-02: 0.25 mg via SUBCUTANEOUS

## 2022-04-02 MED ORDER — DEXTROSE 5 % IV SOLN
INTRAVENOUS | Status: DC | PRN
  Administered 2022-04-02: 3 g via INTRAVENOUS

## 2022-04-02 MED ORDER — SENNOSIDES-DOCUSATE SODIUM 8.6-50 MG PO TABS
2.0000 | ORAL_TABLET | Freq: Every day | ORAL | Status: DC
  Administered 2022-04-03 – 2022-04-04 (×2): 2 via ORAL
  Filled 2022-04-02 (×2): qty 2

## 2022-04-02 MED ORDER — MEPERIDINE HCL 25 MG/ML IJ SOLN: 6.2500 mg | INTRAMUSCULAR | Status: DC | PRN

## 2022-04-02 MED ORDER — LACTATED RINGERS IV SOLN: INTRAVENOUS | Status: DC

## 2022-04-02 MED ORDER — LACTATED RINGERS IV BOLUS
1000.0000 mL | Freq: Once | INTRAVENOUS | Status: AC
  Administered 2022-04-02: 1000 mL via INTRAVENOUS

## 2022-04-02 MED ORDER — SODIUM CHLORIDE 0.9% FLUSH
3.0000 mL | INTRAVENOUS | Status: DC | PRN
Start: 2022-04-02 — End: 2022-04-05

## 2022-04-02 MED ORDER — LACTATED RINGERS AMNIOINFUSION: INTRAVENOUS | Status: DC

## 2022-04-02 MED ORDER — TERBUTALINE SULFATE 1 MG/ML IJ SOLN
0.2500 mg | Freq: Once | INTRAMUSCULAR | Status: AC
  Administered 2022-04-02: 0.25 mg via SUBCUTANEOUS

## 2022-04-02 MED ORDER — MENTHOL 3 MG MT LOZG: 1.0000 | LOZENGE | OROMUCOSAL | Status: DC | PRN

## 2022-04-02 MED ORDER — FENTANYL CITRATE (PF) 100 MCG/2ML IJ SOLN
100.0000 ug | Freq: Once | INTRAMUSCULAR | Status: AC
  Administered 2022-04-02: 100 ug via INTRAVENOUS
  Filled 2022-04-02: qty 2

## 2022-04-02 MED ORDER — PRENATAL MULTIVITAMIN CH
1.0000 | ORAL_TABLET | Freq: Every day | ORAL | Status: DC
  Administered 2022-04-03 – 2022-04-04 (×2): 1 via ORAL
  Filled 2022-04-02 (×2): qty 1

## 2022-04-02 MED ORDER — PHENYLEPHRINE 80 MCG/ML (10ML) SYRINGE FOR IV PUSH (FOR BLOOD PRESSURE SUPPORT)
80.0000 ug | PREFILLED_SYRINGE | INTRAVENOUS | Status: DC | PRN
  Administered 2022-04-02 (×2): 80 ug via INTRAVENOUS

## 2022-04-02 MED ORDER — TERBUTALINE SULFATE 1 MG/ML IJ SOLN
0.2500 mg | Freq: Once | INTRAMUSCULAR | Status: AC
  Administered 2022-04-02: 0.25 mg via SUBCUTANEOUS
  Filled 2022-04-02: qty 1

## 2022-04-02 MED ORDER — SODIUM CHLORIDE 0.9 % IV SOLN
INTRAVENOUS | Status: DC | PRN
  Administered 2022-04-02: 500 mg via INTRAVENOUS

## 2022-04-02 MED ORDER — FENTANYL-BUPIVACAINE-NACL 0.5-0.125-0.9 MG/250ML-% EP SOLN
12.0000 mL/h | EPIDURAL | Status: DC | PRN
  Administered 2022-04-02: 12 mL/h via EPIDURAL

## 2022-04-02 MED ORDER — SODIUM CHLORIDE 0.9 % IR SOLN
Status: DC | PRN
  Administered 2022-04-02: 1

## 2022-04-02 MED ORDER — TRANEXAMIC ACID-NACL 1000-0.7 MG/100ML-% IV SOLN
INTRAVENOUS | Status: DC | PRN
  Administered 2022-04-02: 1000 mg via INTRAVENOUS

## 2022-04-02 MED ORDER — FENTANYL-BUPIVACAINE-NACL 0.5-0.125-0.9 MG/250ML-% EP SOLN
EPIDURAL | Status: AC
  Filled 2022-04-02: qty 250

## 2022-04-02 MED ORDER — OXYTOCIN-SODIUM CHLORIDE 30-0.9 UT/500ML-% IV SOLN
2.5000 [IU]/h | INTRAVENOUS | Status: AC
Start: 2022-04-02 — End: 2022-04-03

## 2022-04-02 MED ORDER — LACTATED RINGERS IV SOLN: 500.0000 mL | Freq: Once | INTRAVENOUS | Status: DC

## 2022-04-02 MED ORDER — PHENYLEPHRINE 80 MCG/ML (10ML) SYRINGE FOR IV PUSH (FOR BLOOD PRESSURE SUPPORT)
PREFILLED_SYRINGE | INTRAVENOUS | Status: AC
  Filled 2022-04-02: qty 10

## 2022-04-02 MED ORDER — IBUPROFEN 600 MG PO TABS
600.0000 mg | ORAL_TABLET | Freq: Four times a day (QID) | ORAL | Status: DC
  Administered 2022-04-03 – 2022-04-04 (×4): 600 mg via ORAL
  Filled 2022-04-02 (×4): qty 1

## 2022-04-02 MED ORDER — DIPHENHYDRAMINE HCL 25 MG PO CAPS: 25.0000 mg | ORAL_CAPSULE | Freq: Four times a day (QID) | ORAL | Status: DC | PRN

## 2022-04-02 MED ORDER — ONDANSETRON HCL 4 MG/2ML IJ SOLN
4.0000 mg | Freq: Three times a day (TID) | INTRAMUSCULAR | Status: DC | PRN
  Administered 2022-04-02: 4 mg via INTRAVENOUS
  Filled 2022-04-02: qty 2

## 2022-04-02 MED ORDER — NALOXONE HCL 4 MG/10ML IJ SOLN: 1.0000 ug/kg/h | INTRAVENOUS | Status: DC | PRN

## 2022-04-02 MED ORDER — OXYCODONE HCL 5 MG PO TABS: 5.0000 mg | ORAL_TABLET | ORAL | Status: DC | PRN

## 2022-04-02 MED ORDER — SODIUM CHLORIDE 0.9 % IV SOLN
3.0000 g | Freq: Four times a day (QID) | INTRAVENOUS | Status: AC
  Administered 2022-04-02 – 2022-04-03 (×4): 3 g via INTRAVENOUS
  Filled 2022-04-02 (×4): qty 8

## 2022-04-02 MED ORDER — EPHEDRINE 5 MG/ML INJ: 10.0000 mg | INTRAVENOUS | Status: DC | PRN

## 2022-04-02 MED ORDER — LIDOCAINE-EPINEPHRINE (PF) 1.5 %-1:200000 IJ SOLN
INTRAMUSCULAR | Status: DC | PRN
  Administered 2022-04-02: 5 mL via EPIDURAL

## 2022-04-02 MED ORDER — TRANEXAMIC ACID-NACL 1000-0.7 MG/100ML-% IV SOLN
INTRAVENOUS | Status: AC
  Filled 2022-04-02: qty 100

## 2022-04-02 MED ORDER — SODIUM BICARBONATE 8.4 % IV SOLN
INTRAVENOUS | Status: DC | PRN
  Administered 2022-04-02: 5 mL via EPIDURAL
  Administered 2022-04-02: 3 mL via EPIDURAL

## 2022-04-02 MED ORDER — SIMETHICONE 80 MG PO CHEW
80.0000 mg | CHEWABLE_TABLET | Freq: Three times a day (TID) | ORAL | Status: DC
  Administered 2022-04-03 – 2022-04-04 (×3): 80 mg via ORAL
  Filled 2022-04-02 (×4): qty 1

## 2022-04-02 MED ORDER — WITCH HAZEL-GLYCERIN EX PADS: 1.0000 | MEDICATED_PAD | CUTANEOUS | Status: DC | PRN

## 2022-04-02 SURGICAL SUPPLY — 41 items
BENZOIN TINCTURE PRP APPL 2/3 (GAUZE/BANDAGES/DRESSINGS) ×1 IMPLANT
CHLORAPREP W/TINT 26ML (MISCELLANEOUS) ×2 IMPLANT
CLAMP CORD UMBIL (MISCELLANEOUS) ×1 IMPLANT
CLOTH BEACON ORANGE TIMEOUT ST (SAFETY) ×1 IMPLANT
DRESSING PREVENA PLUS CUSTOM (GAUZE/BANDAGES/DRESSINGS) IMPLANT
DRSG OPSITE POSTOP 4X10 (GAUZE/BANDAGES/DRESSINGS) ×1 IMPLANT
DRSG PREVENA PLUS CUSTOM (GAUZE/BANDAGES/DRESSINGS) ×1
ELECT REM PT RETURN 9FT ADLT (ELECTROSURGICAL) ×1
ELECTRODE REM PT RTRN 9FT ADLT (ELECTROSURGICAL) ×1 IMPLANT
EXTRACTOR VACUUM KIWI (MISCELLANEOUS) ×1 IMPLANT
GAUZE SPONGE 4X4 12PLY STRL LF (GAUZE/BANDAGES/DRESSINGS) IMPLANT
GLOVE BIOGEL PI IND STRL 6 (GLOVE) ×1 IMPLANT
GLOVE BIOGEL PI IND STRL 6.5 (GLOVE) ×1 IMPLANT
GLOVE BIOGEL PI IND STRL 7.0 (GLOVE) ×2 IMPLANT
GLOVE BIOGEL PI INDICATOR 6 (GLOVE) ×1
GLOVE BIOGEL PI INDICATOR 6.5 (GLOVE) ×1
GLOVE BIOGEL PI INDICATOR 7.0 (GLOVE) ×2
GOWN STRL REUS W/TWL LRG LVL3 (GOWN DISPOSABLE) ×2 IMPLANT
KIT ABG SYR 3ML LUER SLIP (SYRINGE) IMPLANT
NDL HYPO 25X5/8 SAFETYGLIDE (NEEDLE) IMPLANT
NEEDLE HYPO 25X5/8 SAFETYGLIDE (NEEDLE) IMPLANT
NS IRRIG 1000ML POUR BTL (IV SOLUTION) ×1 IMPLANT
PACK C SECTION WH (CUSTOM PROCEDURE TRAY) ×1 IMPLANT
PAD ABD 7.5X8 STRL (GAUZE/BANDAGES/DRESSINGS) IMPLANT
PAD OB MATERNITY 4.3X12.25 (PERSONAL CARE ITEMS) ×1 IMPLANT
RETRACTOR WND ALEXIS 25 LRG (MISCELLANEOUS) ×1 IMPLANT
RTRCTR WOUND ALEXIS 25CM LRG (MISCELLANEOUS) ×1
SPONGE LAP 18X18 X RAY DECT (DISPOSABLE) IMPLANT
STRIP CLOSURE SKIN 1/2X4 (GAUZE/BANDAGES/DRESSINGS) IMPLANT
SUT MNCRL 0 VIOLET CTX 36 (SUTURE) ×2 IMPLANT
SUT MON AB 3-0 SH 27 (SUTURE) ×1
SUT MON AB 3-0 SH27 (SUTURE) IMPLANT
SUT MONOCRYL 0 CTX 36 (SUTURE) ×2
SUT PLAIN 2 0 XLH (SUTURE) IMPLANT
SUT VIC AB 0 CT1 36 (SUTURE) ×1 IMPLANT
SUT VIC AB 3-0 CT1 27 (SUTURE)
SUT VIC AB 3-0 CT1 TAPERPNT 27 (SUTURE) IMPLANT
SUT VIC AB 4-0 PS2 27 (SUTURE) ×1 IMPLANT
TOWEL OR 17X24 6PK STRL BLUE (TOWEL DISPOSABLE) ×1 IMPLANT
TRAY FOLEY W/BAG SLVR 14FR LF (SET/KITS/TRAYS/PACK) IMPLANT
WATER STERILE IRR 1000ML POUR (IV SOLUTION) ×1 IMPLANT

## 2022-04-02 NOTE — Anesthesia Preprocedure Evaluation (Addendum)
Anesthesia Evaluation  Patient identified by MRN, date of birth, ID band Patient awake    Reviewed: Allergy & Precautions, NPO status , Patient's Chart, lab work & pertinent test results  Airway Mallampati: III  TM Distance: >3 FB Neck ROM: Full    Dental no notable dental hx.    Pulmonary neg pulmonary ROS,    Pulmonary exam normal        Cardiovascular hypertension, Pt. on medications  Rhythm:Regular Rate:Normal     Neuro/Psych negative neurological ROS  negative psych ROS   GI/Hepatic negative GI ROS, Neg liver ROS,   Endo/Other  Morbid obesity  Renal/GU negative Renal ROS  negative genitourinary   Musculoskeletal negative musculoskeletal ROS (+)   Abdominal Normal abdominal exam  (+)   Peds  Hematology negative hematology ROS (+)   Anesthesia Other Findings   Reproductive/Obstetrics (+) Pregnancy                            Anesthesia Physical Anesthesia Plan  ASA: 2  Anesthesia Plan: Epidural   Post-op Pain Management:    Induction:   PONV Risk Score and Plan: 2 and Treatment may vary due to age or medical condition  Airway Management Planned: Natural Airway  Additional Equipment: None  Intra-op Plan:   Post-operative Plan:   Informed Consent: I have reviewed the patients History and Physical, chart, labs and discussed the procedure including the risks, benefits and alternatives for the proposed anesthesia with the patient or authorized representative who has indicated his/her understanding and acceptance.     Dental advisory given  Plan Discussed with:   Anesthesia Plan Comments: (Lab Results      Component                Value               Date                      WBC                      16.9 (H)            04/02/2022                HGB                      13.2                04/02/2022                HCT                      39.2                04/02/2022                 MCV                      87.5                04/02/2022                PLT                      238  04/02/2022          )        Anesthesia Quick Evaluation

## 2022-04-02 NOTE — H&P (Cosign Needed Addendum)
OB ADMISSION/ HISTORY & PHYSICAL:  Admission Date: 04/02/2022  5:09 AM  Admit Diagnosis: Normal labor.     Hailey Harris is a 41 y.o. female presenting for  .  Prenatal History: G2P0010   EDC : 04/14/2022, by Last Menstrual Period  [facility] care at [facility] since 8 weeks.   Primary Dr. Pamala Hurry  Prenatal course complicated by: - Dated by LMP c/w 8 wk u/s - obesity - h/o PCOS, nl DS and not on metformin - AMA, nl Panorama - Anemia with low ferritin of 10 despite oral iron, s/p IV iron x 2 - chronic htn, suspected at 10 wks, diagnosed at 14 wks, pt started on baby ASA and '100mg'$  po labetalol tid, bp well controlled on this dose throughout preg  Prenatal Labs: ABO, Rh: O (03/18 0000)  Antibody: NEG (08/30 0700) Rubella: Immune (02/13 0000)  RPR: NON REACTIVE (08/30 0700)  HBsAg: Negative (02/13 0000)  HIV: Non-reactive (02/13 0000)  GBS: Negative/-- (08/08 0000)  1 hr Glucola : 116  Genetic Screening: NL NIPS  Ultrasound: NL anatomy and AGA  Vaccines: TDaP       Yes:   02/03/2022         Medical / Surgical History :  Past medical history:  Past Medical History:  Diagnosis Date   Amenorrhea, secondary    Hirsutism    Hypertension    Infertility, female    LGSIL on Pap smear of cervix 01/2005   Patient denies medical problems    PCOS (polycystic ovarian syndrome)    Pre-diabetes      Past surgical history:  Past Surgical History:  Procedure Laterality Date   NO PAST SURGERIES       Family History:  Family History  Problem Relation Age of Onset   Breast cancer Mother 50       Triple Negative   Heart disease Father    Kidney cancer Maternal Grandmother    Diabetes Maternal Grandmother    Hypertension Paternal Grandmother      Social History:  reports that she has never smoked. She has never used smokeless tobacco. She reports that she does not currently use alcohol. She reports that she does not use drugs.  Allergies: Patient has no known allergies.    Current Medications at time of admission:  Medications Prior to Admission  Medication Sig Dispense Refill Last Dose   Ferrous Gluconate (IRON 27 PO) Take 1 tablet by mouth every other day.   Past Week   labetalol (NORMODYNE) 100 MG tablet Take 100 mg by mouth 3 (three) times daily.   04/01/2022 at 1530   Prenatal Vit-DSS-Fe Cbn-FA (PRENATAL AD PO) Take 1 tablet by mouth daily.   04/01/2022     Review of Systems: See HPI  Physical Exam: Vital signs and nursing notes reviewed.  Patient Vitals for the past 24 hrs:  BP Temp Temp src Pulse Resp SpO2 Height Weight  04/02/22 1201 127/68 -- -- 87 18 -- -- --  04/02/22 1159 -- 98.3 F (36.8 C) Oral -- -- -- -- --  04/02/22 1131 129/74 -- -- 90 -- -- -- --  04/02/22 1129 -- -- -- -- 16 -- -- --  04/02/22 1101 123/70 -- -- 91 16 -- -- --  04/02/22 1034 126/67 -- -- (!) 105 -- -- -- --  04/02/22 1016 120/61 -- -- (!) 104 16 -- -- --  04/02/22 1011 116/66 -- -- (!) 103 -- -- -- --  04/02/22  1006 121/69 -- -- (!) 115 16 -- -- --  04/02/22 1001 117/66 -- -- (!) 105 -- -- -- --  04/02/22 0957 (!) 127/59 -- -- (!) 106 16 -- -- --  04/02/22 0951 121/63 -- -- (!) 109 -- -- -- --  04/02/22 0945 116/61 -- -- 96 -- 99 % -- --  04/02/22 0942 121/77 -- -- 90 -- -- -- --  04/02/22 0928 -- -- -- -- -- -- '5\' 8"'$  (1.727 m) (!) 137.9 kg  04/02/22 0924 131/71 98.1 F (36.7 C) Oral 98 18 -- -- --  04/02/22 0645 (!) 113/59 -- -- 84 -- 99 % -- --  04/02/22 0526 124/85 98 F (36.7 C) Oral 95 20 100 % -- --     General: AAO x 3, NAD, coping well with epidural  Heart: RRR Lungs:CTAB Abdomen: Gravid, NT, Leopold's vertex, fetal spine to maternal left  Extremities: trace pedal edema Genitalia / VE: Dilation: 4 Effacement (%): 90 Cervical Position: Middle Station: -2, -1 Presentation: Vertex Exam by:: Conan Bowens, SNM AROM with small amount of thick meconium, IUPC placed for amnioinfusion, and good return noted  FHR: 150 BPM, minimal to moderate  variability, no accels, recurrent late decels with late component to nadir of 70-90 BPM TOCO: Ctx 2-3.5 minutes, some coupling with low amplitude contractions. MVUs 100-200   Labs:   Pending T&S, CBC, RPR  Recent Labs    04/02/22 0700  WBC 16.9*  HGB 13.2  HCT 39.2  PLT 238     Assessment/Plan:  41 y.o. G2P0010 at 16w2dChronic HTN on baby ASA and labetalol BID since beginning of pregnancy, well controlled. No neural symptoms of pre-eclampsia. Admit labs for pre-eclampsia benign. Continue to monitor closely for exacerbating blood pressures, continue current management.  2. AMA with normal ANFT and AGA.  3. Obese  4. Hx PCOS, normal GS  Fetal wellbeing - FHT category 2. AROM and IUPC/amnioinfusion to relieve suspected cord compression.  EFW 6.5 - 7 lbs, AGA  Labor: Early latent labor. AROM augmentation. Suboptimal contractile force, will monitor and consider pitocin augmentation if fetal status permits.   GBS Negative  Rubella Immune  Rh Positive   Pain control: Epidural effective   Anticipated MOD: Guarded, working toward NSVB.  Plans to breastfeed, plans circumcision. POC discussed with patient and support team, all questions answered.  Dr TRonita Hippsnotified of admission / plan of care   SConan BowensCNM, MSN 04/02/2022, 12:02 PM     Medical screening examination/treatment/procedure(s) were conducted as a shared visit with non-physician practitioner(s) and myself.  I personally evaluated the patient during the encounter.   DJuliene Pina CNM, MSN 04/02/2022, 1:16 PM

## 2022-04-02 NOTE — Anesthesia Procedure Notes (Signed)
Epidural Patient location during procedure: OB Start time: 04/02/2022 9:35 AM End time: 04/02/2022 9:42 AM  Staffing Anesthesiologist: Darral Dash, DO Performed: anesthesiologist   Preanesthetic Checklist Completed: patient identified, IV checked, site marked, risks and benefits discussed, surgical consent, monitors and equipment checked, pre-op evaluation and timeout performed  Epidural Patient position: sitting Prep: ChloraPrep Patient monitoring: heart rate, continuous pulse ox and blood pressure Approach: midline Location: L4-L5 Injection technique: LOR saline  Needle:  Needle type: Tuohy  Needle gauge: 17 G Needle length: 9 cm Needle insertion depth: 9 cm Catheter type: closed end flexible Catheter size: 20 Guage Catheter at skin depth: 14 cm Test dose: negative and 1.5% lidocaine with Epi 1:200 K  Assessment Events: blood not aspirated, injection not painful, no injection resistance and no paresthesia  Additional Notes Patient identified. Risks/Benefits/Options discussed with patient including but not limited to bleeding, infection, nerve damage, paralysis, failed block, incomplete pain control, headache, blood pressure changes, nausea, vomiting, reactions to medications, itching and postpartum back pain. Confirmed with bedside nurse the patient's most recent platelet count. Confirmed with patient that they are not currently taking any anticoagulation, have any bleeding history or any family history of bleeding disorders. Patient expressed understanding and wished to proceed. All questions were answered. Sterile technique was used throughout the entire procedure. Please see nursing notes for vital signs. Test dose was given through epidural catheter and negative prior to continuing to dose epidural or start infusion. Warning signs of high block given to the patient including shortness of breath, tingling/numbness in hands, complete motor block, or any concerning  symptoms with instructions to call for help. Patient was given instructions on fall risk and not to get out of bed. All questions and concerns addressed with instructions to call with any issues or inadequate analgesia.    Reason for block:procedure for pain

## 2022-04-02 NOTE — MAU Note (Addendum)
This RN at bedside to introduce self to patient and adjust EFM.  Around 0723 this RN positioned pt into right lateral position and explained reasoning for position change. Pt unable to tolerate position and states she needs to be standing. This RN remained at bedside assessing [place]. LR fluid bolus in progress.   Around 0730 pt reporting that contractions feel more intense and that she is having increased pressure. VE performed and is 3/90/BBOW with bloody show. Discussed FHR with Dr Ronita Hipps at [phone] and orders for fentanyl and terbutaline received. Medications and administered. Pt agreeable to position change and was put into right lateral position.   Felicia RN called labor and delivery charge nurse for updates regarding a room around [phone] and provided and update regarding FHR tracing. No room available at this time.   Dr Ronita Hipps updated regarding FHR tracing and room status by Hansel Feinstein CNM.

## 2022-04-02 NOTE — Transfer of Care (Signed)
Immediate Anesthesia Transfer of Care Note  Patient: Hailey Harris  Procedure(s) Performed: CESAREAN SECTION  Patient Location: PACU  Anesthesia Type:Regional and Epidural  Level of Consciousness: awake, alert , oriented and patient cooperative  Airway & Oxygen Therapy: Patient Spontanous Breathing  Post-op Assessment: Report given to RN and Post -op Vital signs reviewed and stable  Post vital signs: Reviewed and stable  Last Vitals:  Vitals Value Taken Time  BP 120/70 04/02/22 1743  Temp    Pulse 102 04/02/22 1746  Resp 18 04/02/22 1746  SpO2 100 % 04/02/22 1746  Vitals shown include unvalidated device data.  Last Pain:  Vitals:   04/02/22 1507  TempSrc: Axillary  PainSc:          Complications: No notable events documented.

## 2022-04-02 NOTE — Progress Notes (Signed)
Pt comfortable with epidural  Patient Vitals for the past 24 hrs:  BP Temp Temp src Pulse Resp SpO2 Height Weight  04/02/22 1507 (!) 128/55 99.3 F (37.4 C) Axillary (!) 103 18 -- -- --  04/02/22 1503 (!) 146/98 -- -- (!) 113 -- -- -- --  04/02/22 1435 -- 98.2 F (36.8 C) Oral -- -- -- -- --  04/02/22 1432 133/76 -- -- (!) 101 -- -- -- --  04/02/22 1402 114/76 -- -- 94 -- -- -- --  04/02/22 1354 -- 98 F (36.7 C) Oral -- -- -- -- --  04/02/22 1332 121/75 -- -- 86 16 -- -- --  04/02/22 1302 108/67 -- -- 82 16 -- -- --  04/02/22 1232 127/83 -- -- 91 16 -- -- --  04/02/22 1201 127/68 -- -- 87 18 -- -- --  04/02/22 1159 -- 98.3 F (36.8 C) Oral -- -- -- -- --  04/02/22 1131 129/74 -- -- 90 -- -- -- --  04/02/22 1129 -- -- -- -- 16 -- -- --  04/02/22 1101 123/70 -- -- 91 16 -- -- --  04/02/22 1034 126/67 -- -- (!) 105 -- -- -- --  04/02/22 1016 120/61 -- -- (!) 104 16 -- -- --  04/02/22 1011 116/66 -- -- (!) 103 -- -- -- --  04/02/22 1006 121/69 -- -- (!) 115 16 -- -- --  04/02/22 1001 117/66 -- -- (!) 105 -- -- -- --  04/02/22 0957 (!) 127/59 -- -- (!) 106 16 -- -- --  04/02/22 0951 121/63 -- -- (!) 109 -- -- -- --  04/02/22 0945 116/61 -- -- 96 -- 99 % -- --  04/02/22 0942 121/77 -- -- 90 -- -- -- --  04/02/22 0928 -- -- -- -- -- -- '5\' 8"'$  (1.727 m) (!) 137.9 kg  04/02/22 0924 131/71 98.1 F (36.7 C) Oral 98 18 -- -- --  04/02/22 0645 (!) 113/59 -- -- 84 -- 99 % -- --  04/02/22 0526 124/85 98 F (36.7 C) Oral 95 20 100 % -- --   A&ox3 Nml respirations Abd: soft,nt,nd gravid Cx: 4/90/-1 per nursing  FHT: 150s, nml varaibility, +accels; s/p amnioinfusion - deep variables have resolved but recurrent lates persist despite position changes; throughout day there have been frequent variables and late decelerations Toco; q 1 min  A/P: iup at 38.2 wga Fetal intolerance to labor - I have reviewed the above with patient and her family; at this time while patient is now adequate with  her contractions, recurrent lates and d/t persistent lates throughout several hours, recommend proceed to c/s for delivery; pt is very agreeable; risk of procedure discussed: risk of bleeding (poss need for transfusion), risk of infection, risk of injury to bowel, bladder, nerves, blood vessels; risk of further surgery; risk of blood clot to legs/lungs, risk of anesthesia; consent signed; proceed to OR; will give terbutaline now

## 2022-04-02 NOTE — MAU Provider Note (Signed)
Chief Complaint:  Contractions   Event Date/Time   First Provider Initiated Contact with Patient 04/02/22 0559     HPI  HPI: Hailey Harris is a 41 y.o. G2P0010 at 60w2dho presents to maternity admissions reporting painful contractions.  RN noted variable decels with contractions so I was asked to see her. Pt states Dr FPamala Hurryheard "a decel" in office and did an UKoreawhich "was fine" . She reports good fetal movement, denies LOF, vaginal bleeding.     Past Medical History: Past Medical History:  Diagnosis Date   Amenorrhea, secondary    Hirsutism    Hypertension    Infertility, female    LGSIL on Pap smear of cervix 01/2005   Patient denies medical problems    PCOS (polycystic ovarian syndrome)    Pre-diabetes     Past obstetric history: OB History  Gravida Para Term Preterm AB Living  2 0 0 0 1    SAB IAB Ectopic Multiple Live Births  1 0 0        # Outcome Date GA Lbr Len/2nd Weight Sex Delivery Anes PTL Lv  2 Current           1 SAB             Past Surgical History: Past Surgical History:  Procedure Laterality Date   NO PAST SURGERIES      Family History: Family History  Problem Relation Age of Onset   Breast cancer Mother 583      Triple Negative   Heart disease Father    Kidney cancer Maternal Grandmother    Diabetes Maternal Grandmother    Hypertension Paternal Grandmother     Social History: Social History   Tobacco Use   Smoking status: Never   Smokeless tobacco: Never  Vaping Use   Vaping Use: Never used  Substance Use Topics   Alcohol use: Not Currently    Comment: occasional   Drug use: No    Allergies: No Known Allergies  Meds:  Medications Prior to Admission  Medication Sig Dispense Refill Last Dose   Ferrous Gluconate (IRON 27 PO) Take 1 tablet by mouth every other day.   Past Week   labetalol (NORMODYNE) 100 MG tablet Take 100 mg by mouth 3 (three) times daily.   04/01/2022 at 1530   Prenatal Vit-DSS-Fe Cbn-FA (PRENATAL AD PO)  Take 1 tablet by mouth daily.   04/01/2022    I have reviewed patient's Past Medical Hx, Surgical Hx, Family Hx, Social Hx, medications and allergies.   ROS:  Review of Systems  Constitutional:  Negative for fever.  Eyes:  Negative for visual disturbance.  Respiratory:  Negative for shortness of breath.   Genitourinary:  Positive for pelvic pain.  Neurological:  Negative for weakness.   Other systems negative  Physical Exam  Patient Vitals for the past 24 hrs:  BP Temp Temp src Pulse Resp SpO2  04/02/22 0526 124/85 98 F (36.7 C) Oral 95 20 100 %   Constitutional: Well-developed, well-nourished female in no acute distress.  Cardiovascular: normal rate and rhythm Respiratory: normal effort, clear to auscultation bilaterally GI: Abd soft, non-tender, gravid appropriate for gestational age.   No rebound or guarding. MS: Extremities nontender, no edema, normal ROM Neurologic: Alert and oriented x 4.  GU: Neg CVAT.  Dilation: 1.5 Effacement (%): 60 Cervical Position: Posterior Station: -3 Presentation: Vertex Exam by:: AYoulanda Roys RN  FHT:  Baseline 155 , moderate variability, accelerations present, Variable decelerations  to 90 with almost every contraction. Contractions:  Irregular     Labs: No results found for this or any previous visit (from the past 24 hour(s)). O/Positive/-- (03/18 0000)  Imaging:  AFI normal  BPP 6/8  MAU Course/MDM: I have reviewed the triage vital signs and the nursing notes.   Pertinent labs & imaging results that were available during my care of the patient were reviewed by me and considered in my medical decision making (see chart for details).      I have reviewed her medical records including past results, notes and treatments.   I have ordered US for BPP and AFI and reviewed results.   BPP 6/8, AFI normal  NST reviewed, equivocal  Consult Dr Ronita Hipps with presentation, exam findings and test results. Will admit He wants [place] to give her  Terbutaline now to stop contractions until he finishes his delivery   Assessment: Single IUP at 28w2dUterine contractions  Variable Decelerations  Plan: .Admit to Labor and Delivery Routine orders Terbutaline tocolysis Dr TRonita Hippsto follow  MHansel FeinsteinCNM, MSN Certified Nurse-Midwife 04/02/2022 5:59 AM

## 2022-04-02 NOTE — Progress Notes (Signed)
Pt has been nauseous and vomitting since she got up to me at 2000. I called Derrell Lolling and got orders to give Zofran '4mg'$  now and if she is still nauseous an hour later to give 25 mg phenergen IV piggyback,

## 2022-04-02 NOTE — MAU Note (Addendum)
.  Hailey Harris is a 41 y.o. at 12w2dhere in MAU reporting: ctx started at 0245 unsure how far apart ctx are. Reports pink spotting but denies significant bleeding or LOF. +FM. Last SVE yesterday was 1 cm. Hx of chronic hypertension and taking 100 mg labetalol 3x a day.. last dose at 1530 yesterday.   Onset of complaint: 0245 Pain score: 9    FHT:150 Lab orders placed from triage:  labor eval.

## 2022-04-02 NOTE — Op Note (Signed)
Cesarean Section Procedure Note   Hailey Harris  04/02/2022  Indications: Fetal Distress, remote from delivery  Pre-operative Diagnosis: fetal intolerance to labor; CHTN, ama  Post-operative Diagnosis: Same; chorioamnionitis Procedure: 1ltcs  Surgeon: Surgeon(s) and Role:    * Korinne Greenstein, Earlyne Iba, MD - Primary   Assistants: Derrell Lolling, CNM  Anesthesia: epidural   Procedure Details:  The patient was seen in the Holding Room. The risks, benefits, complications, treatment options, and expected outcomes were discussed with the patient. The patient concurred with the proposed plan, giving informed consent. identified as Hailey Harris and the procedure verified as C-Section Delivery. A Time Out was held and the above information confirmed.  After induction of anesthesia, the patient was draped and prepped in the usual sterile manner, foley was draining urine well.  A pfannenstiel incision was made and carried down through the subcutaneous tissue to the fascia. Fascial incision was made and extended transversely. The fascia was separated from the underlying rectus tissue superiorly and inferiorly. The peritoneum was identified and entered. Peritoneal incision was extended longitudinally. Alexis-O retractor placed. The utero-vesical peritoneal reflection was incised transversely and the bladder flap was bluntly freed from the lower uterine segment. A low transverse uterine incision was made. A very strong fould odor was immediately noted after uterine incision, and the uterus was warm when my hand was placed in the uterus to flex fetal head to deliver head.  Delivered from cephalic presentation was a viable female infant with vigorous cry. Apgar scores of 8 at one minute and 9 at five minutes. Delayed cord clamping done at 1 minute and baby handed to NICU team in attendance. Cord ph was not sent. Cord blood was obtained for evaluation. The placenta was removed Intact, spontaneously and appeared normal. The  uterine outline, tubes and ovaries appeared normal} though left tube appeared to have a 1-2cm firm area of ovary  but otherwise wnl.  The uterine incision was closed with running locked sutures of 21moocryl. A second imbricating layer was sutured.   Hemostasis was observed. Alexis retractor removed. Peritoneal closure done with 2-0 Vicryl.  The fascia was then reapproximated with running sutures of 0Vicryl. The subcuticular closure was performed using 2-0plain gut. The skin was closed with 4-0Vicryl. Prevena dressing placed.  Instrument, sponge, and needle counts were correct prior the abdominal closure and were correct at the conclusion of the case.    Findings: normal uterus though appears chorioamnionitis; nml tubes bilat, nml right ovary, left ovary wnl    Estimated Blood Loss: 11075m  Total IV Fluids: 110085m Urine Output:  100CC OF clear urine  Specimens: placenta to pathology  Complications: no complications  Disposition: PACU - hemodynamically stable.   Maternal Condition: stable   Baby condition / location:  Couplet care / Skin to Skin  Attending Attestation: I performed the procedure.   Signed: Surgeon(s): AlmMurrell ReddensEarlyne IbaD     \

## 2022-04-03 ENCOUNTER — Inpatient Hospital Stay (HOSPITAL_COMMUNITY): Payer: BC Managed Care – PPO

## 2022-04-03 ENCOUNTER — Inpatient Hospital Stay (HOSPITAL_COMMUNITY): Admission: AD | Admit: 2022-04-03 | Payer: BC Managed Care – PPO | Source: Home / Self Care | Admitting: Obstetrics

## 2022-04-03 ENCOUNTER — Encounter (HOSPITAL_COMMUNITY): Payer: Self-pay | Admitting: Obstetrics and Gynecology

## 2022-04-03 LAB — CBC
HCT: 31.2 % — ABNORMAL LOW (ref 36.0–46.0)
Hemoglobin: 10.4 g/dL — ABNORMAL LOW (ref 12.0–15.0)
MCH: 29.5 pg (ref 26.0–34.0)
MCHC: 33.3 g/dL (ref 30.0–36.0)
MCV: 88.6 fL (ref 80.0–100.0)
Platelets: 204 10*3/uL (ref 150–400)
RBC: 3.52 MIL/uL — ABNORMAL LOW (ref 3.87–5.11)
RDW: 19 % — ABNORMAL HIGH (ref 11.5–15.5)
WBC: 27.2 10*3/uL — ABNORMAL HIGH (ref 4.0–10.5)
nRBC: 0 % (ref 0.0–0.2)

## 2022-04-03 MED ORDER — LACTATED RINGERS IV SOLN: INTRAVENOUS | Status: DC

## 2022-04-03 MED ORDER — SODIUM CHLORIDE 0.9 % IV SOLN
25.0000 mg | Freq: Once | INTRAVENOUS | Status: AC
  Administered 2022-04-03: 25 mg via INTRAVENOUS
  Filled 2022-04-03: qty 1

## 2022-04-03 MED ORDER — SODIUM CHLORIDE 0.9 % IV SOLN
25.0000 mg | Freq: Once | INTRAVENOUS | Status: DC
  Filled 2022-04-03: qty 1

## 2022-04-03 NOTE — Lactation Note (Signed)
This note was copied from a baby's chart. Lactation Consultation Note  Patient Name: Hailey Harris [id number] Date: 04/03/2022 Reason for consult: Initial assessment;1st time breastfeeding;Early term 37-38.6wks;Other (Comment);Maternal endocrine disorder (C/S delivery) Age:41 hours Infant feeding choice is breast and donor breast milk. Infant latched on the left breast using the football hold position, infant BF for 15 minutes. Afterwards infant was given 10 mls of donor breast milk by Support Person while Birth Parent was using the DEBP. Birth Parent will continue to BF according to hunger cues, on demand, 8 to 12+ times within 24 hours, STS. Birth Parent will continue to pump every 3 hours for 15 minutes on initial setting and offer any EBM first after latching infant at the breast before donor breast milk. Birth Parent knows EBM is safe for 4 hours at room temperature whereas donor milk is safe for 1 hour. Birth Parent was made aware of O/P services, breastfeeding support groups, community resources, and our phone # for post-discharge questions.   Maternal Data Has patient been taught Hand Expression?: Yes Does the patient have breastfeeding experience prior to this delivery?: No  Feeding Mother's Current Feeding Choice: Breast Milk and Donor Milk  LATCH Score Latch: Grasps breast easily, tongue down, lips flanged, rhythmical sucking.  Audible Swallowing: A few with stimulation  Type of Nipple: Everted at rest and after stimulation  Comfort (Breast/Nipple): Soft / non-tender  Hold (Positioning): Assistance needed to correctly position infant at breast and maintain latch.  LATCH Score: 8   Lactation Tools Discussed/Used Tools: Pump;Flanges Flange Size: 24 Breast pump type: Double-Electric Breast Pump Reason for Pumping: Birth Parent wit hx of PCOS, colostrum not present with hand expression, ETI female infant.  Interventions Interventions: Breast feeding basics  reviewed;Assisted with latch;Skin to skin;Breast compression;Adjust position;Support pillows;Position options;Expressed milk;Education;DEBP;LC Services brochure  Discharge    Consult Status Consult Status: Follow-up Date: 04/03/22 Follow-up type: In-patient    Vicente Serene 04/03/2022, 3:10 AM

## 2022-04-03 NOTE — Anesthesia Postprocedure Evaluation (Signed)
Anesthesia Post Note  Patient: Hailey Harris  Procedure(s) Performed: CESAREAN SECTION     Patient location during evaluation: PACU Anesthesia Type: Epidural Level of consciousness: awake and alert Pain management: pain level controlled Vital Signs Assessment: post-procedure vital signs reviewed and stable Respiratory status: spontaneous breathing, nonlabored ventilation, respiratory function stable and patient connected to nasal cannula oxygen Cardiovascular status: stable and blood pressure returned to baseline Postop Assessment: no apparent nausea or vomiting Anesthetic complications: no   No notable events documented.  Last Vitals:  Vitals:   04/03/22 0500 04/03/22 0551  BP:  98/63  Pulse:  70  Resp:  18  Temp:  36.7 C  SpO2: 100%     Last Pain:  Vitals:   04/03/22 0720  TempSrc:   PainSc: 0-No pain                 Belenda Cruise P Logun Colavito

## 2022-04-03 NOTE — Anesthesia Postprocedure Evaluation (Signed)
Anesthesia Post Note  Patient: Hailey Harris  Procedure(s) Performed: Angie     Patient location during evaluation: Mother Baby Anesthesia Type: Epidural Level of consciousness: awake and alert Pain management: pain level controlled Vital Signs Assessment: post-procedure vital signs reviewed and stable Respiratory status: spontaneous breathing, nonlabored ventilation and respiratory function stable Cardiovascular status: stable Postop Assessment: no headache, no backache and epidural receding Anesthetic complications: no   No notable events documented.  Last Vitals:  Vitals:   04/03/22 0500 04/03/22 0551  BP:  98/63  Pulse:  70  Resp:  18  Temp:  36.7 C  SpO2: 100%     Last Pain:  Vitals:   04/03/22 0720  TempSrc:   PainSc: 0-No pain   Pain Goal:                   Sahara Fujimoto

## 2022-04-03 NOTE — Addendum Note (Signed)
Addendum  created 04/03/22 0835 by Ignacia Bayley, CRNA   Clinical Note Signed

## 2022-04-03 NOTE — Social Work (Signed)
CSW received consult for "Answered 1 to the thought of harming herself on [place] scale." CSW met with [name] to offer support and complete assessment.    CSW met with [name] at bedside and introduced CSW role. CSW observed [name] breastfeeding and bonding with the infant while FOB was attentive to her needs at bedside. [name] presented calm and engaged with CSW during the visit. CSW offered [name] privacy. FOB left room to allow [name] privacy. CSW explained the reason for the visit. CSW inquired how [name] has felt since giving birth. [name] reported that she has been "feeling fine" and shared she had a good L&D despite having a c-section due to medical complications. [name] reported during the pregnancy she "felt good" and had a very "strong support system." CSW discussed the [place]. [name] reported she answered the [place] not based on the past seven days but overall, how she felt during and before the pregnancy. [name] disclosed that she had "passive suicidal thoughts prior to learning about the pregnancy in January." [name] shared, "I lost my mom eight years ago and I did not grieve properly. " [name] reported that she suffered a miscarriage in July 2022, and "lost an aunt that was like a second mother to me, and my husband's aunt passed." [name] reported she felt very overwhelmed by all the losses. [name] reported she actively sees therapist Earley Brooke on a virtual platform. [name] reported the sessions have been helpful and going well. The next appointment is scheduled for September 16th. CSW discussed PPD/PPA. [name] reported she is concern about PPD/PPA and hopes the continuation of therapy will continue to help. [name] reported she feels comfortable reaching out to her physician if concerns arise.  CSW provided education regarding the baby blues period vs. perinatal mood disorders, discussed treatment and gave resources for mental health follow up if concerns arise.  CSW recommended [name] complete a self-evaluation during the postpartum time  period using the New Mom Checklist from Postpartum Progress and encouraged [name] to contact a medical professional if symptoms are noted at any time. CSW assessed [name] for safety. [name] denied thoughts of harm to self and others. [name] denied domestic violence concerns.  CSW reported she has all items for the infant including a bassinet where the infant will sleep. CSW provided review of Sudden Infant Death Syndrome (SIDS) precautions. [name] has chosen [facility] for the infant's follow up care. CSW assessed [name] for additional needs. [name] denied further needs.   CSW identifies no further need for intervention and no barriers to discharge at this time.   Kathrin Greathouse, MSW, LCSW [facility] [place] Worker  [phone] 02-25-22  12:26 PM

## 2022-04-03 NOTE — Progress Notes (Signed)
Subjective: POD# 1 Live born female  Birth Weight: 6 lb 9.5 oz (2990 g) APGAR: 8, 9  Newborn Delivery   Birth date/time: 04/02/2022 16:36:00 Delivery type: C-Section, Low Transverse C-section categorization: Primary     Baby name: Savion  Delivering provider: Tiana Loft E   circumcision desires Feeding: breast, pumping and bottle - Infant latches for 15 minutes then supplements with 10 ml of donor milk per maternal desire until milk comes in. Has worked with lactation.   Pain control at delivery: Epidural   Reports feeling Good, is very tired, states she wasn't able to keep her eyes open and was falling asleep mid-conversation this morning. Now awake and only occasionally drifting to sleep. Has not been able to keep anything down since surgery and vomited when she got up for the first time this AM. Plans to try to eat some cream of wheat and fruit soon.   Patient reports not tolerating PO.   Breast symptoms: none Pain controlled with acetaminophen and prescription NSAID's including ketorolac (Toradol) Denies HA/SOB/C/P/N/V/dizziness. Flatus not present - pt denies need to pass gas. She reports vaginal bleeding as normal, without clots.   Ambulated x1 this AM 10 ft in room. Did not go to the bathroom, foley still in place.  Pt denies chills, muscle aches or feeling feverish.  Objective:   VS:    Vitals:   04/02/22 2230 04/03/22 0230 04/03/22 0500 04/03/22 0551  BP: (!) 99/58 (!) 96/53  98/63  Pulse: 93 65  70  Resp: '18 16  18  '$ Temp:  98.1 F (36.7 C)  98.1 F (36.7 C)  TempSrc:  Oral    SpO2: 95% 98% 100%   Weight:      Height:         Intake/Output Summary (Last 24 hours) at 04/03/2022 0945 Last data filed at 04/03/2022 6606 Gross per 24 hour  Intake 1400 ml  Output 2331 ml  Net -931 ml        Recent Labs    04/02/22 0700 04/03/22 0610  WBC 16.9* 27.2*  HGB 13.2 10.4*  HCT 39.2 31.2*  PLT 238 204     Blood type: --/--/O POS (08/30  0700)  Rubella: Immune (02/13 0000)  Vaccines: TDaP          up to date           Physical Exam:  General: cooperative, appears stated age, fatigued, and alert only to auditory stimulus.  Upon second evaluation, alert.  CV: Regular rate and rhythm Resp: clear Abdomen: Soft, distended, non-tender, diminished bowel sounds Incision:  unable to assess d/t Previna, no drainage in canister. Uterine Fundus: firm, below umbilicus, nontender Lochia: minimal GU: urine concentrated Ext: extremities normal, atraumatic, no cyanosis, mild +1 pitting pedal edema.   Assessment/Plan: 41 y.o.   POD# 1. [id number]                  Principal Problem:   Postpartum care following cesarean delivery - 8/30 Active Problems:   Encounter for induction of labor   Obesity    PCOS - not on metformin    AMA   Cesarean delivery - 1LTCS - NRFHT   Chorioamnionitis: Afebrile, no abdominal tenderness, odor or purulence, denies fever symptoms.  - Ampicillin-Sulbactam 3 g IV Q6 hrs   Chronic hypertension in pregnancy - BPs stable postpartum without medication. Was on Labetalol 100 mg TID prenatally. Continue to monitor for need for treatment.  RN to remove foley and help pt to ambulate Advance diet as tolerated  Encourage rest when baby rests Breastfeeding support Encourage to ambulate Routine post-op care  Chari Manning, BSN, RN, [facility] 04/03/2022, 9:45 AM

## 2022-04-04 LAB — CBC
HCT: 27.7 % — ABNORMAL LOW (ref 36.0–46.0)
Hemoglobin: 8.9 g/dL — ABNORMAL LOW (ref 12.0–15.0)
MCH: 28.9 pg (ref 26.0–34.0)
MCHC: 32.1 g/dL (ref 30.0–36.0)
MCV: 89.9 fL (ref 80.0–100.0)
Platelets: 223 10*3/uL (ref 150–400)
RBC: 3.08 MIL/uL — ABNORMAL LOW (ref 3.87–5.11)
RDW: 19.2 % — ABNORMAL HIGH (ref 11.5–15.5)
WBC: 18.3 10*3/uL — ABNORMAL HIGH (ref 4.0–10.5)
nRBC: 0 % (ref 0.0–0.2)

## 2022-04-04 LAB — SURGICAL PATHOLOGY

## 2022-04-04 MED ORDER — IBUPROFEN 600 MG PO TABS: 600.0000 mg | ORAL_TABLET | Freq: Four times a day (QID) | ORAL | 0 refills | Status: DC

## 2022-04-04 MED ORDER — OXYCODONE HCL 5 MG PO TABS: 5.0000 mg | ORAL_TABLET | ORAL | 0 refills | Status: AC | PRN

## 2022-04-04 NOTE — Discharge Summary (Signed)
OB Discharge Summary  Patient Name: Hailey Harris DOB: Feb 05, 1981 MRN: [id number]  Date of admission: 04/02/2022 Delivering MD: Tiana Loft E   Date of discharge: 04/04/2022  Admitting diagnosis: Encounter for induction of labor [Z34.90] Intrauterine pregnancy: [redacted]w[redacted]d    Secondary diagnosis:Principal Problem:   Postpartum care following cesarean delivery - 8/30 Active Problems:   Encounter for induction of labor   Cesarean delivery - 1LTCS - NRFHT   Chorioamnionitis   Chronic hypertension in pregnancy  Additional problems:none     Discharge diagnosis: Term Pregnancy Delivered                                                                     Post partum procedures: none  Augmentation: AROM and Pitocin  Complications: Intrauterine Inflammation or infection (Chorioamniotis)  Hospital course:  Onset of Labor With Unplanned C/S   41y.o. yo G2P1011 at 41w2das admitted in Latent Labor on 04/02/2022. Patient had a labor course significant for chorioamnionitis, fetal intolerance to labor. The patient went for cesarean section due to Non-Reassuring FHR. Delivery details as follows: Membrane Rupture Time/Date: 11:45 AM ,04/02/2022   Delivery Method:C-Section, Low Transverse  Details of operation can be found in separate operative note. Patient had an uncomplicated postpartum course.  She is ambulating,tolerating a regular diet, passing flatus, and urinating well.  Patient is discharged home in stable condition 04/04/22.  Newborn Data: Birth date:04/02/2022  Birth time:4:36 PM  Gender:Female  Living status:Living  Apgars:8 ,9  Weight:2990 g   Physical exam  Vitals:   04/03/22 0551 04/03/22 1040 04/03/22 2007 04/04/22 0521  BP: 98/63 (!) 94/58 118/63 93/62  Pulse: 70 74 72 80  Resp: '18 18 18 18  '$ Temp: 98.1 F (36.7 C) 99.1 F (37.3 C) 98.5 F (36.9 C) 98.5 F (36.9 C)  TempSrc:  Oral Oral Oral  SpO2:    98%  Weight:      Height:       General: alert, cooperative,  and no distress Lochia: appropriate Uterine Fundus: firm Incision: Healing well with no significant drainage DVT Evaluation: No evidence of DVT seen on physical exam. Labs: Lab Results  Component Value Date   WBC 18.3 (H) 04/04/2022   HGB 8.9 (L) 04/04/2022   HCT 27.7 (L) 04/04/2022   MCV 89.9 04/04/2022   PLT 223 04/04/2022      Latest Ref Rng & Units 05/02/2019    6:27 PM  CMP  Glucose 70 - 99 mg/dL 94   BUN 6 - 20 mg/dL 12   Creatinine 0.44 - 1.00 mg/dL 0.96   Sodium 135 - 145 mmol/L 140   Potassium 3.5 - 5.1 mmol/L 3.6   Chloride 98 - 111 mmol/L 106   CO2 22 - 32 mmol/L 23   Calcium 8.9 - 10.3 mg/dL 9.1   Total Protein 6.5 - 8.1 g/dL 7.6   Total Bilirubin 0.3 - 1.2 mg/dL 0.4   Alkaline Phos 38 - 126 U/L 89   AST 15 - 41 U/L 28   ALT 0 - 44 U/L 33     Discharge instruction: per After Visit Summary and "Baby and Me Booklet".  After Visit Meds:  Allergies as of 04/04/2022   No Known Allergies  Medication List     STOP taking these medications    labetalol 100 MG tablet Commonly known as: NORMODYNE       TAKE these medications    acetaminophen 500 MG tablet Commonly known as: TYLENOL Take 1,000 mg by mouth daily as needed for mild pain or headache.   FERROUS SULFATE PO Take 1 tablet by mouth daily.   ibuprofen 600 MG tablet Commonly known as: ADVIL Take 1 tablet (600 mg total) by mouth every 6 (six) hours.   oxyCODONE 5 MG immediate release tablet Commonly known as: Oxy IR/ROXICODONE Take 1-2 tablets (5-10 mg total) by mouth every 4 (four) hours as needed for up to 5 days for moderate pain.   PRENATAL PO Take 1 tablet by mouth daily.   TUMS PO Take 1-2 tablets by mouth 2 (two) times daily as needed (heartburn, indigestion).        Diet: routine diet  Activity: Advance as tolerated. Pelvic rest for 6 weeks.   Outpatient follow up:1 week Follow up Appt:No future appointments. Follow up visit: No follow-ups on file.  Postpartum  contraception: Not Discussed  Newborn Data: Live born female  Birth Weight: 6 lb 9.5 oz (2990 g) APGAR: 8, 9  Newborn Delivery   Birth date/time: 04/02/2022 16:36:00 Delivery type: C-Section, Low Transverse C-section categorization: Primary      Baby Feeding: Breast Disposition:home with mother   04/04/2022 Ala Dach, MD

## 2022-04-04 NOTE — Lactation Note (Signed)
This note was copied from a baby's chart. Lactation Consultation Note  Patient Name: Hailey Harris [id number] Date: 04/04/2022   Age:41 hours Birth Parent would like LC return later to help with latch assistance. [facility] written name on [facility].  Maternal Data    Feeding    LATCH Score                    Lactation Tools Discussed/Used    Interventions    Discharge    Consult Status      Hailey Harris 04/04/2022, 4:05 PM

## 2022-04-04 NOTE — Progress Notes (Signed)
POD# 2  S: Pt notes pain controlled w/ po meds, minimal lochia, nl void, out of bed w/o dizziness or chest pain, tol reg po, + flatus. Pt is  breastfeeding. Pt denies fever or significant abdominal tenderness.   Vitals:   04/03/22 0551 04/03/22 1040 04/03/22 2007 04/04/22 0521  BP: 98/63 (!) 94/58 118/63 93/62  Pulse: 70 74 72 80  Resp: '18 18 18 18  '$ Temp: 98.1 F (36.7 C) 99.1 F (37.3 C) 98.5 F (36.9 C) 98.5 F (36.9 C)  TempSrc:  Oral Oral Oral  SpO2:    98%  Weight:      Height:        Gen: well appearing CV: RRR Pulm: CTAB Abd: soft, ND, approp tender, fundus below umbilicus, NT Inc: C/D/I LE: tr edema, NT  CBC    Component Value Date/Time   WBC 18.3 (H) 04/04/2022 0532   RBC 3.08 (L) 04/04/2022 0532   HGB 8.9 (L) 04/04/2022 0532   HCT 27.7 (L) 04/04/2022 0532   PLT 223 04/04/2022 0532   MCV 89.9 04/04/2022 0532   MCV 93.4 06/22/2013 1019   MCH 28.9 04/04/2022 0532   MCHC 32.1 04/04/2022 0532   RDW 19.2 (H) 04/04/2022 0532    A/P: POD#  2 s/p PCS for chorio and NRFHT - post-op. Doing well. Expect d/c home tomorrow - chorio, s/p 24 hrs Unasyn. No si/sx infection and WBC trending down - anemia, plan iron at home, pt has received 2 doses IV iron last month -chronic hypertension but lower bps PP, labetalol on hold.   Ala Dach 04/04/2022 10:07 AM

## 2022-04-10 ENCOUNTER — Telehealth (HOSPITAL_COMMUNITY): Payer: Self-pay | Admitting: *Deleted

## 2022-04-10 NOTE — Telephone Encounter (Signed)
Mom reports feeling good. Incision healing well per mom. No concerns regarding herself at this time. EPDS=0 (hospital score=8) Mom reports baby is well. Feeding, peeing, and pooping without difficulty. Reviewed safe sleep. Mom has no concerns about baby at present.  Odis Hollingshead, RN 04-10-2022 at 11:04am

## 2022-04-23 ENCOUNTER — Encounter: Payer: Self-pay | Admitting: Internal Medicine

## 2022-04-23 ENCOUNTER — Ambulatory Visit: Payer: BC Managed Care – PPO | Admitting: Internal Medicine

## 2022-04-23 DIAGNOSIS — M779 Enthesopathy, unspecified: Secondary | ICD-10-CM | POA: Diagnosis not present

## 2022-04-23 MED ORDER — DICLOFENAC SODIUM 1 % EX GEL: 2.0000 g | Freq: Three times a day (TID) | CUTANEOUS | 3 refills | Status: DC

## 2022-04-23 NOTE — Patient Instructions (Signed)
We have sent in voltaren gel to use 3 times a day on the wrist.   You can also take ibuprofen to help this heal.   It is a tendon inflammation and will go away.  If not improving let us know we can consider oral prednisone as a back up option.

## 2022-04-23 NOTE — Progress Notes (Signed)
   Subjective:   Patient ID: Hailey Harris, female    DOB: 29-Mar-1981, 41 y.o.   MRN: [id number]  HPI The patient is a 41 YO female with new right wrist pain. New baby about 55 month old.   Review of Systems  Constitutional: Negative.   HENT: Negative.    Eyes: Negative.   Respiratory:  Negative for cough, chest tightness and shortness of breath.   Cardiovascular:  Negative for chest pain, palpitations and leg swelling.  Gastrointestinal:  Negative for abdominal distention, abdominal pain, constipation, diarrhea, nausea and vomiting.  Musculoskeletal:  Positive for myalgias.  Skin: Negative.   Neurological: Negative.   Psychiatric/Behavioral: Negative.      Objective:  Physical Exam Constitutional:      Appearance: She is well-developed.  HENT:     Head: Normocephalic and atraumatic.  Cardiovascular:     Rate and Rhythm: Normal rate and regular rhythm.  Pulmonary:     Effort: Pulmonary effort is normal. No respiratory distress.     Breath sounds: Normal breath sounds. No wheezing or rales.  Abdominal:     General: Bowel sounds are normal. There is no distension.     Palpations: Abdomen is soft.     Tenderness: There is no abdominal tenderness. There is no rebound.  Musculoskeletal:        General: Tenderness present.     Cervical back: Normal range of motion.  Skin:    General: Skin is warm and dry.  Neurological:     Mental Status: She is alert and oriented to person, place, and time.     Coordination: Coordination normal.     Vitals:   04/23/22 1131  BP: 132/84  Pulse: 78  Temp: 98.6 F (37 C)  SpO2: 99%  Weight: 277 lb (125.6 kg)  Height: '5\' 8"'$  (1.727 m)    Assessment & Plan:

## 2022-04-25 ENCOUNTER — Encounter: Payer: Self-pay | Admitting: Internal Medicine

## 2022-04-25 DIAGNOSIS — M779 Enthesopathy, unspecified: Secondary | ICD-10-CM | POA: Insufficient documentation

## 2022-04-25 NOTE — Assessment & Plan Note (Signed)
Right wrist likely tendonitis from hormonal change, newborn. Rx voltaren gel and can use ibuprofen oral prn for pain.

## 2022-05-09 ENCOUNTER — Telehealth: Payer: Self-pay | Admitting: *Deleted

## 2022-05-09 NOTE — Telephone Encounter (Signed)
[facility] DICLOFENAC SODIUM TOPICAL GEL 1% --was denied

## 2022-08-29 ENCOUNTER — Ambulatory Visit: Payer: BC Managed Care – PPO | Admitting: Internal Medicine

## 2022-10-07 ENCOUNTER — Other Ambulatory Visit: Payer: Self-pay

## 2022-10-07 ENCOUNTER — Ambulatory Visit: Payer: BC Managed Care – PPO | Admitting: Internal Medicine

## 2022-10-07 ENCOUNTER — Telehealth: Payer: Self-pay | Admitting: Internal Medicine

## 2022-10-07 ENCOUNTER — Encounter: Payer: Self-pay | Admitting: Internal Medicine

## 2022-10-07 VITALS — BP 160/120 | HR 71 | Temp 98.1°F | Ht 68.0 in | Wt 300.5 lb

## 2022-10-07 DIAGNOSIS — M2240 Chondromalacia patellae, unspecified knee: Secondary | ICD-10-CM | POA: Insufficient documentation

## 2022-10-07 DIAGNOSIS — M222X9 Patellofemoral disorders, unspecified knee: Secondary | ICD-10-CM | POA: Insufficient documentation

## 2022-10-07 DIAGNOSIS — I10 Essential (primary) hypertension: Secondary | ICD-10-CM | POA: Diagnosis not present

## 2022-10-07 DIAGNOSIS — Z6841 Body Mass Index (BMI) 40.0 and over, adult: Secondary | ICD-10-CM

## 2022-10-07 DIAGNOSIS — Z23 Encounter for immunization: Secondary | ICD-10-CM

## 2022-10-07 MED ORDER — ZEPBOUND 5 MG/0.5ML ~~LOC~~ SOAJ: 5.0000 mg | SUBCUTANEOUS | 0 refills | Status: DC

## 2022-10-07 MED ORDER — ZEPBOUND 10 MG/0.5ML ~~LOC~~ SOAJ
10.0000 mg | SUBCUTANEOUS | 0 refills | Status: DC
Start: 2022-10-07 — End: 2023-01-28

## 2022-10-07 MED ORDER — ZEPBOUND 7.5 MG/0.5ML ~~LOC~~ SOAJ: 7.5000 mg | SUBCUTANEOUS | 0 refills | Status: DC

## 2022-10-07 MED ORDER — ZEPBOUND 12.5 MG/0.5ML ~~LOC~~ SOAJ: 12.5000 mg | SUBCUTANEOUS | 0 refills | Status: DC

## 2022-10-07 MED ORDER — ZEPBOUND 2.5 MG/0.5ML ~~LOC~~ SOAJ
2.5000 mg | SUBCUTANEOUS | 0 refills | Status: DC
Start: 2022-10-07 — End: 2022-10-07

## 2022-10-07 MED ORDER — ZEPBOUND 5 MG/0.5ML ~~LOC~~ SOAJ
5.0000 mg | SUBCUTANEOUS | 0 refills | Status: DC
Start: 2022-10-07 — End: 2022-10-07

## 2022-10-07 MED ORDER — ZEPBOUND 2.5 MG/0.5ML ~~LOC~~ SOAJ: 2.5000 mg | SUBCUTANEOUS | 0 refills | Status: DC

## 2022-10-07 MED ORDER — ZEPBOUND 10 MG/0.5ML ~~LOC~~ SOAJ
10.0000 mg | SUBCUTANEOUS | 0 refills | Status: DC
Start: 2022-10-07 — End: 2022-10-07

## 2022-10-07 NOTE — Patient Instructions (Addendum)
We have sent in the zepbound to see if this is covered  The options for weight loss alternative are topirimate, wellbutrin, naltrexone.

## 2022-10-07 NOTE — Progress Notes (Unsigned)
   Subjective:   Patient ID: Hailey Harris, female    DOB: 12-06-1980, 42 y.o.   MRN: [id number]  HPI The patient is a 42 YO female coming in to discuss weight loss.  Review of Systems  Constitutional: Negative.   HENT: Negative.    Eyes: Negative.   Respiratory:  Negative for cough, chest tightness and shortness of breath.   Cardiovascular:  Negative for chest pain, palpitations and leg swelling.  Gastrointestinal:  Negative for abdominal distention, abdominal pain, constipation, diarrhea, nausea and vomiting.  Musculoskeletal: Negative.   Skin: Negative.   Neurological: Negative.   Psychiatric/Behavioral: Negative.      Objective:  Physical Exam Constitutional:      Appearance: She is well-developed. She is obese.  HENT:     Head: Normocephalic and atraumatic.  Cardiovascular:     Rate and Rhythm: Normal rate and regular rhythm.  Pulmonary:     Effort: Pulmonary effort is normal. No respiratory distress.     Breath sounds: Normal breath sounds. No wheezing or rales.  Abdominal:     General: Bowel sounds are normal. There is no distension.     Palpations: Abdomen is soft.     Tenderness: There is no abdominal tenderness. There is no rebound.  Musculoskeletal:     Cervical back: Normal range of motion.  Skin:    General: Skin is warm and dry.  Neurological:     Mental Status: She is alert and oriented to person, place, and time.     Coordination: Coordination normal.     Vitals:   10/07/22 1534 10/07/22 1537  BP: (!) 180/120 (!) 160/120  Pulse: 71   Temp: 98.1 F (36.7 C)   TempSrc: Oral   SpO2: 99%   Weight: (!) 300 lb 8 oz (136.3 kg)   Height: '5\' 8"'$  (1.727 m)     Assessment & Plan:  Visit time 25 minutes in face to face communication with patient and coordination of care, additional 5 minutes spent in record review, coordination or care, ordering tests, communicating/referring to other healthcare professionals, documenting in medical records all on the same  day of the visit for total time 30 minutes spent on the visit.

## 2022-10-07 NOTE — Telephone Encounter (Signed)
This has been resent to patient pharmacy recommended.

## 2022-10-07 NOTE — Telephone Encounter (Signed)
The patient said the Zepbound prescribed today 10/07/22 was sent to the wrong pharmacy. It needs to be sent to the [facility] on [address] in [place]. Best callback is [phone].

## 2022-10-09 NOTE — Assessment & Plan Note (Signed)
Spent time discussing weight, weight loss strategies including medication and non-medication. Rx zepbound standard titration and advised that there is a very small chance this would be covered. If not covered given wellbutrin, topamax, metformin as options.

## 2022-10-09 NOTE — Assessment & Plan Note (Signed)
BP is severely elevated today and she is getting back on labetalol rx by ob/gyn and asked her to check BP at home on meds and let us know.

## 2023-01-12 LAB — HM MAMMOGRAPHY

## 2023-01-13 ENCOUNTER — Encounter: Payer: Self-pay | Admitting: Obstetrics

## 2023-01-28 ENCOUNTER — Ambulatory Visit: Payer: BC Managed Care – PPO | Admitting: Internal Medicine

## 2023-01-28 VITALS — BP 160/120 | HR 69 | Temp 98.3°F | Ht 68.0 in | Wt 292.0 lb

## 2023-01-28 DIAGNOSIS — I10 Essential (primary) hypertension: Secondary | ICD-10-CM | POA: Diagnosis not present

## 2023-01-28 MED ORDER — LABETALOL HCL 100 MG PO TABS: 100.0000 mg | ORAL_TABLET | Freq: Two times a day (BID) | ORAL | 3 refills | Status: DC

## 2023-01-28 NOTE — Assessment & Plan Note (Signed)
She is not taking her labetalol as she ran out and was hoping she did not need this anymore. Given severely high BP here we will resume labetalol 100 mg BID. She is unsure if she wants another pregnancy so we will ensure this is safe in case of pregnancy. We have advised to avoid this until BP is controlled.

## 2023-01-28 NOTE — Progress Notes (Signed)
   Subjective:   Patient ID: Hailey Harris, female    DOB: 04/08/1981, 42 y.o.   MRN: [id number]  HPI The patient is a 42 YO female coming in for high BP. Was taking labetalol but ran out and did not continue or call for refill. Denies headache or chest pains.  Review of Systems  Constitutional: Negative.   HENT: Negative.    Eyes: Negative.   Respiratory:  Negative for cough, chest tightness and shortness of breath.   Cardiovascular:  Negative for chest pain, palpitations and leg swelling.  Gastrointestinal:  Negative for abdominal distention, abdominal pain, constipation, diarrhea, nausea and vomiting.  Musculoskeletal: Negative.   Skin: Negative.   Neurological: Negative.   Psychiatric/Behavioral: Negative.      Objective:  Physical Exam Constitutional:      Appearance: She is well-developed. She is obese.  HENT:     Head: Normocephalic and atraumatic.  Cardiovascular:     Rate and Rhythm: Normal rate and regular rhythm.  Pulmonary:     Effort: Pulmonary effort is normal. No respiratory distress.     Breath sounds: Normal breath sounds. No wheezing or rales.  Abdominal:     General: Bowel sounds are normal. There is no distension.     Palpations: Abdomen is soft.     Tenderness: There is no abdominal tenderness. There is no rebound.  Musculoskeletal:     Cervical back: Normal range of motion.  Skin:    General: Skin is warm and dry.  Neurological:     Mental Status: She is alert and oriented to person, place, and time.     Coordination: Coordination normal.     Vitals:   01/28/23 0858 01/28/23 0902  BP: (!) 160/120 (!) 160/120  Pulse: 69   Temp: 98.3 F (36.8 C)   TempSrc: Oral   SpO2: 98%   Weight: 292 lb (132.5 kg)   Height: 5\' 8"  (1.727 m)     Assessment & Plan:

## 2023-01-28 NOTE — Patient Instructions (Signed)
We have sent in labetalol to take 100 mg twice a day to help keep the blood pressure good.

## 2023-10-14 ENCOUNTER — Ambulatory Visit (INDEPENDENT_AMBULATORY_CARE_PROVIDER_SITE_OTHER): Admitting: Internal Medicine

## 2023-10-14 ENCOUNTER — Encounter: Payer: Self-pay | Admitting: Internal Medicine

## 2023-10-14 VITALS — BP 140/100 | HR 68 | Temp 98.0°F | Ht 68.0 in | Wt 278.0 lb

## 2023-10-14 DIAGNOSIS — I10 Essential (primary) hypertension: Secondary | ICD-10-CM

## 2023-10-14 NOTE — Progress Notes (Unsigned)
   Subjective:   Patient ID: Hailey Harris, female    DOB: 1981-07-17, 43 y.o.   MRN: [id number]  HPI The patient is a 43 YO female coming in with high BP recenlty. No symptoms.   Review of Systems  Constitutional: Negative.   HENT: Negative.    Eyes: Negative.   Respiratory:  Negative for cough, chest tightness and shortness of breath.   Cardiovascular:  Negative for chest pain, palpitations and leg swelling.  Gastrointestinal:  Negative for abdominal distention, abdominal pain, constipation, diarrhea, nausea and vomiting.  Musculoskeletal: Negative.   Skin: Negative.   Neurological: Negative.   Psychiatric/Behavioral: Negative.      Objective:  Physical Exam Constitutional:      Appearance: She is well-developed. She is obese.  HENT:     Head: Normocephalic and atraumatic.  Cardiovascular:     Rate and Rhythm: Normal rate and regular rhythm.  Pulmonary:     Effort: Pulmonary effort is normal. No respiratory distress.     Breath sounds: Normal breath sounds. No wheezing or rales.  Abdominal:     General: Bowel sounds are normal. There is no distension.     Palpations: Abdomen is soft.     Tenderness: There is no abdominal tenderness. There is no rebound.  Musculoskeletal:     Cervical back: Normal range of motion.  Skin:    General: Skin is warm and dry.  Neurological:     Mental Status: She is alert and oriented to person, place, and time.     Coordination: Coordination normal.     Vitals:   10/14/23 0959 10/14/23 1004  BP: (!) 140/100 (!) 140/100  Pulse: 68   Temp: 98 F (36.7 C)   TempSrc: Oral   SpO2: 98%   Weight: 278 lb (126.1 kg)   Height: 5\' 8"  (1.727 m)     Assessment & Plan:

## 2023-10-14 NOTE — Patient Instructions (Addendum)
 Call [place] attention specialist to get testing for ADD. [phone]  We will increase the labetalol to 2 pills in the morning and 1 pill still in the evening.

## 2023-10-15 NOTE — Assessment & Plan Note (Signed)
 BP is high today and running high at home. We have increased labetalol to 200 mg qam and 100 mg qpm. Follow up 2-4 weeks.

## 2023-10-15 NOTE — Assessment & Plan Note (Signed)
 BMI 42 and working on weight loss. Is down overall and working slow and steady.

## 2024-01-15 ENCOUNTER — Other Ambulatory Visit: Payer: Self-pay | Admitting: Internal Medicine

## 2024-02-26 LAB — HM MAMMOGRAPHY

## 2024-03-05 ENCOUNTER — Other Ambulatory Visit: Payer: Self-pay | Admitting: Internal Medicine

## 2024-05-30 ENCOUNTER — Ambulatory Visit (HOSPITAL_BASED_OUTPATIENT_CLINIC_OR_DEPARTMENT_OTHER): Admitting: Student

## 2024-05-30 ENCOUNTER — Ambulatory Visit (INDEPENDENT_AMBULATORY_CARE_PROVIDER_SITE_OTHER)

## 2024-05-30 DIAGNOSIS — M25561 Pain in right knee: Secondary | ICD-10-CM

## 2024-05-30 NOTE — Progress Notes (Signed)
 Chief Complaint: Right knee pain    Discussed the use of AI scribe software for clinical note transcription with the patient, who gave verbal consent to proceed.  History of Present Illness Hailey Harris is a pleasant 43 year old female who presents with knee pain following an injury.  She experienced knee pain after stepping over toys in her son's room, hearing a 'pop' in her knee. Initially, there was no significant pain, but she now feels pressure when weight is applied. Pain is localized to the medial aspect of the knee, with no significant swelling. Occasional knee buckling occurs, managed with a brace. She uses Aleve for pain relief, which allows mobility with a slight limp. Pain does not exceed five out of ten. 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   Surgical History:   None  PMH/PSH/Family History/Social History/Meds/Allergies:    Past Medical History:  Diagnosis Date   Amenorrhea, secondary    Hirsutism    Hypertension    Infertility, female    LGSIL on Pap smear of cervix 01/2005   Patient denies medical problems    PCOS (polycystic ovarian syndrome)    Pre-diabetes    Past Surgical History:  Procedure Laterality Date   CESAREAN SECTION N/A 04/02/2022   Procedure: CESAREAN SECTION;  Surgeon: Linnell Devere BRAVO, MD;  Location: [facility];  Service: Obstetrics;  Laterality: N/A;   NO PAST SURGERIES     Social History   Socioeconomic History   Marital status: Married    Spouse name: Not on file   Number of children: Not on file   Years of education: Not on file   Highest education level: Bachelor's degree (e.g., BA, AB, BS)  Occupational History   Not on file  Tobacco Use   Smoking status: Never   Smokeless tobacco: Never  Vaping Use   Vaping status: Never Used  Substance and Sexual Activity   Alcohol use: Not Currently    Comment:  occasional   Drug use: No   Sexual activity: Yes    Birth control/protection: None  Other Topics Concern   Not on file  Social History Narrative   ** Merged History Encounter **       Social Drivers of Health   Financial Resource Strain: Low Risk  (01/28/2023)   Overall Financial Resource Strain (CARDIA)    Difficulty of Paying Living Expenses: Not hard at all  Food Insecurity: No Food Insecurity (01/28/2023)   Hunger Vital Sign    Worried About Running Out of Food in the Last Year: Never true    Ran Out of Food in the Last Year: Never true  Transportation Needs: No Transportation Needs (01/28/2023)   PRAPARE - Administrator, Civil Service (Medical): No    Lack of Transportation (Non-Medical): No  Physical Activity: Insufficiently Active (01/28/2023)   Exercise Vital Sign    Days of Exercise per Week: 2 days    Minutes of Exercise per Session: 30 min  Stress: No Stress Concern Present (01/28/2023)   [employer] of Occupational Health - Occupational Stress Questionnaire    Feeling of Stress : Not at all  Social Connections: Socially Integrated (01/28/2023)   Social Connection and Isolation Panel  Frequency of Communication with Friends and Family: Three times a week    Frequency of Social Gatherings with Friends and Family: Once a week    Attends Religious Services: More than 4 times per year    Active Member of [employer] or Organizations: Yes    Attends Banker Meetings: 1 to 4 times per year    Marital Status: Married   Family History  Problem Relation Age of Onset   Breast cancer Mother 54       Triple Negative   Heart disease Father    Kidney cancer Maternal Grandmother    Diabetes Maternal Grandmother    Hypertension Paternal Grandmother    No Known Allergies Current Outpatient Medications  Medication Sig Dispense Refill   acetaminophen  (TYLENOL ) 500 MG tablet Take 1,000 mg by mouth daily as needed for mild pain or headache.     Calcium  Carbonate Antacid (TUMS PO) Take 1-2 tablets by mouth 2 (two) times daily as needed (heartburn, indigestion).     diclofenac  Sodium (VOLTAREN ) 1 % GEL Apply 2 g topically in the morning, at noon, and at bedtime. 100 g 3   FERROUS SULFATE PO Take 1 tablet by mouth daily.     ibuprofen  (ADVIL ) 600 MG tablet Take 1 tablet (600 mg total) by mouth every 6 (six) hours. 30 tablet 0   labetalol  (NORMODYNE ) 100 MG tablet TAKE 1 TABLET(100 MG) BY MOUTH TWICE DAILY 180 tablet 0   medroxyPROGESTERone  (PROVERA ) 5 MG tablet Take 10 mg by mouth daily.     Prenatal Vit-Fe Fumarate-FA (PRENATAL PO) Take 1 tablet by mouth daily.     No current facility-administered medications for this visit.   No results found.  Review of Systems:   A ROS was performed including pertinent positives and negatives as documented in the HPI.  Physical Exam :   Constitutional: NAD and appears stated age Neurological: Alert and oriented Psych: Appropriate affect and cooperative There were no vitals taken for this visit.   Comprehensive Musculoskeletal Exam:    Exam of the right knee demonstrates tenderness palpation over the medial joint line.  Full active range of motion from 0 to 130 degrees without crepitus.  No laxity with varus or valgus stress.  Negative McMurray.  Imaging:   Xray (right knee 4 views): Mild patellofemoral degenerative changes without evidence of acute bony abnormality   I personally reviewed and interpreted the radiographs.      Assessment & Plan Acute right knee pain  Acute right knee pain is localized medially with minimal degenerative changes. A meniscus injury is considered however current symptoms are manageable therefore we will first proceed with conservative measures. Pain is controlled with Aleve and a knee brace, with no significant functional impairment. Continue Aleve for pain management and wear a knee brace for support. Monitor symptoms for next 10 to 14 days and consider an MRI if  symptoms persist or worsen. Provide a work note to allow sitting and elevating the knee.      I personally saw and evaluated the patient, and participated in the management and treatment plan.  Leonce Reveal, PA-C Orthopedics

## 2024-06-05 ENCOUNTER — Other Ambulatory Visit: Payer: Self-pay | Admitting: Internal Medicine

## 2024-06-06 ENCOUNTER — Other Ambulatory Visit: Payer: Self-pay | Admitting: Internal Medicine

## 2024-06-06 ENCOUNTER — Encounter: Payer: Self-pay | Admitting: Radiology

## 2024-06-16 ENCOUNTER — Ambulatory Visit
Admission: RE | Admit: 2024-06-16 | Discharge: 2024-06-16 | Disposition: A | Discharge status: Home / Self Care | Attending: Family Medicine | Admitting: Family Medicine

## 2024-06-16 VITALS — BP 173/106 | HR 57 | Temp 98.6°F | Resp 18 | Ht 69.0 in | Wt 287.0 lb

## 2024-06-16 DIAGNOSIS — H81391 Other peripheral vertigo, right ear: Secondary | ICD-10-CM | POA: Diagnosis not present

## 2024-06-16 DIAGNOSIS — I10 Essential (primary) hypertension: Secondary | ICD-10-CM

## 2024-06-16 MED ORDER — MECLIZINE HCL 25 MG PO TABS: 25.0000 mg | ORAL_TABLET | Freq: Three times a day (TID) | ORAL | 0 refills | Status: AC | PRN

## 2024-06-16 NOTE — ED Triage Notes (Signed)
 Patient c/o dizziness x 1 week.  Denies any hx of vertigo.  When she stands, it feels like the room is spinning.  Denies nausea or vomiting.

## 2024-06-16 NOTE — Discharge Instructions (Addendum)
 I am including an Epley maneuver.  This is an exercise you can do in your bedroom that will help to extinguish the dizzy sensation May take meclizine for dizziness Continue your daily Zyrtec Your doctor if not better by next week Continue to follow your blood pressure at home

## 2024-06-16 NOTE — ED Provider Notes (Signed)
 Hailey Harris    CSN: [id number] Arrival date & time: 06/16/24  1200      History   Chief Complaint Chief Complaint  Patient presents with   Dizziness    Entered by patient    HPI Hailey Harris is a 43 y.o. female.   Patient states that over the last week she has had a couple brief spells of feeling dizzy.  She states that when she has movement she will feel briefly lightheaded.  No nausea.  No change in vision.  No head injury.  She does have chronic sinus problems and feels she has some pressure in her right ear.  Treated.  She has not fallen.  She does have hypertension but states that she is good about checking her blood pressure at home.  Is been slightly elevated but not significantly.  She does have a primary Harris doctor.  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   Patellofemoral stress syndrome 10/07/2022   Tendonitis 04/25/2022   Essential hypertension 04/02/2022   Routine general medical examination at a health [facility] facility 01/31/2022   Morbid obesity (HCC) 06/02/2017   PCOS (polycystic ovarian syndrome) 06/02/2017   Increased risk of breast cancer 05/25/2014    Past Surgical History:  Procedure Laterality Date   CESAREAN SECTION N/A 04/02/2022   Procedure: CESAREAN SECTION;  Surgeon: Hailey Devere BRAVO, MD;  Location: [facility];  Service: Obstetrics;  Laterality: N/A;   NO PAST SURGERIES      OB History     Gravida  2   Para  1   Term  1   Preterm  0   AB  1   Living  1      SAB  1   IAB  0   Ectopic  0   Multiple  0   Live Births  1            Home Medications    Prior to Admission medications    Medication Sig Start Date End Date Taking? Authorizing Provider  acetaminophen  (TYLENOL ) 500 MG tablet Take 1,000 mg by mouth daily as needed for mild pain or headache.   Yes [provider]  Calcium Carbonate Antacid (TUMS PO) Take 1-2 tablets by mouth 2 (two) times daily as needed (heartburn, indigestion).   Yes [provider]  FERROUS SULFATE PO Take 1 tablet by mouth daily.   Yes [provider]  labetalol  (NORMODYNE ) 100 MG tablet TAKE 1 TABLET(100 MG) BY MOUTH TWICE DAILY. Needs appointment for refills 06/06/24  Yes Rollene Almarie LABOR, MD  meclizine (ANTIVERT) 25 MG tablet Take 1 tablet (25 mg total) by mouth 3 (three) times daily as needed for dizziness. 06/16/24  Yes Hailey Jamee Jacob, MD  sertraline (ZOLOFT) 50 MG tablet Take 50 mg by mouth daily. 06/07/24  Yes [provider]    Family History Family History  Problem Relation Age of Onset   Breast cancer Mother 37       Triple Negative   Heart disease Father    Kidney cancer Maternal Grandmother  Diabetes Maternal Grandmother    Hypertension Paternal Grandmother     Social History Social History   Tobacco Use   Smoking status: Never   Smokeless tobacco: Never  Vaping Use   Vaping status: Never Used  Substance Use Topics   Alcohol use: Not Currently    Comment: occasional   Drug use: No     Allergies   Patient has no known allergies.   Review of Systems Review of Systems  See HPI Physical Exam Triage Vital Signs ED Triage Vitals  Encounter Vitals Group     BP 06/16/24 1214 (!) 173/106     Girls Systolic BP Percentile --      Girls Diastolic BP Percentile --      Boys Systolic BP Percentile --      Boys Diastolic BP Percentile --      Pulse Rate 06/16/24 1214 (!) 57     Resp 06/16/24 1214 18     Temp 06/16/24 1214 98.6 F (37 C)     Temp Source 06/16/24 1214 Oral     SpO2 06/16/24 1214 99 %     Weight 06/16/24 1212 287 lb (130.2 kg)     Height 06/16/24 1212  5' 9 (1.753 m)     Head Circumference --      Peak Flow --      Pain Score 06/16/24 1211 0     Pain Loc --      Pain Education --      Exclude from Growth Chart --    Orthostatic VS for the past 24 hrs:  BP- Lying Pulse- Lying BP- Sitting Pulse- Sitting BP- Standing at 0 minutes Pulse- Standing at 0 minutes  06/16/24 1243 -- -- (!) 173/106 57 134/87 62  06/16/24 1242 (!) 157/100 58 -- -- -- --    Updated Vital Signs BP (!) 173/106 (BP Location: Right Arm)   Pulse (!) 57   Temp 98.6 F (37 C) (Oral)   Resp 18   Ht 5' 9 (1.753 m)   Wt 130.2 kg   LMP 05/18/2024   SpO2 99%   BMI 42.38 kg/m       Physical Exam Constitutional:      General: She is not in acute distress.    Appearance: She is well-developed.  HENT:     Head: Normocephalic and atraumatic.     Right Ear: Tympanic membrane normal.     Left Ear: Tympanic membrane normal.     Nose: Nose normal.     Mouth/Throat:     Mouth: Mucous membranes are moist.     Pharynx: No posterior oropharyngeal erythema.  Eyes:     Conjunctiva/sclera: Conjunctivae normal.     Pupils: Pupils are equal, round, and reactive to light.     Comments: Pupils equal.  Discs are flat.  No nystagmus right  Cardiovascular:     Rate and Rhythm: Normal rate and regular rhythm.     Heart sounds: Normal heart sounds.  Pulmonary:     Effort: Pulmonary effort is normal. No respiratory distress.     Breath sounds: Normal breath sounds.  Musculoskeletal:        General: Normal range of motion.     Cervical back: Normal range of motion.  Skin:    General: Skin is warm and dry.  Neurological:     General: No focal deficit present.     Mental Status: She is alert.      UC Treatments /  Results  Labs (all labs ordered are listed, but only abnormal results are displayed) Labs Reviewed - No data to display  EKG   Radiology No results found.  Procedures Procedures (including critical Harris time)  Medications Ordered in  UC Medications - No data to display  Initial Impression / Assessment and Plan / UC Course  I have reviewed the triage vital signs and the nursing notes.  Pertinent labs & imaging results that were available during my [facility] of the patient were reviewed by me and considered in my medical decision making (see chart for details).     Patient's blood pressure does drop a bit when she sits up.  This does not provoke dizziness.  Dizziness is provoked with eye movement and nystagmus testing.  Recommended that she continue her blood pressure medication and drink more water .  Follow-up with PCP Final Clinical Impressions(s) / UC Diagnoses   Final diagnoses:  Peripheral vertigo involving right ear     Discharge Instructions      I am including an Epley maneuver.  This is an exercise you can do in your bedroom that will help to extinguish the dizzy sensation May take meclizine for dizziness Continue your daily Zyrtec Your doctor if not better by next week Continue to follow your blood pressure at home    ED Prescriptions     Medication Sig Dispense Auth. Provider   meclizine (ANTIVERT) 25 MG tablet Take 1 tablet (25 mg total) by mouth 3 (three) times daily as needed for dizziness. 30 tablet Hailey Jamee Jacob, MD      PDMP not reviewed this encounter.   Hailey Jamee Jacob, MD 06/16/24 1300

## 2024-07-05 ENCOUNTER — Other Ambulatory Visit: Payer: Self-pay | Admitting: Internal Medicine

## 2024-08-31 ENCOUNTER — Other Ambulatory Visit: Payer: Self-pay | Admitting: Medical Genetics

## 2024-09-06 ENCOUNTER — Other Ambulatory Visit

## 2024-09-08 ENCOUNTER — Other Ambulatory Visit

## 2024-09-17 ENCOUNTER — Other Ambulatory Visit

## 2024-09-19 ENCOUNTER — Encounter: Admitting: Internal Medicine
